# Patient Record
Sex: Female | Born: 1945
Health system: Western US, Academic
[De-identification: ages and names within clinical notes are randomized; demographics above are authoritative.]

---

## 2011-10-20 IMAGING — CR [HOSPITAL] L SPINE
1 series · 2 of 2 positions shown · non-contrast
Comparison: none

HISTORY: Back pain.

[Series 1: view not recorded · 0.17mm/px · 2 of 2 slices shown]
[im 1/2]
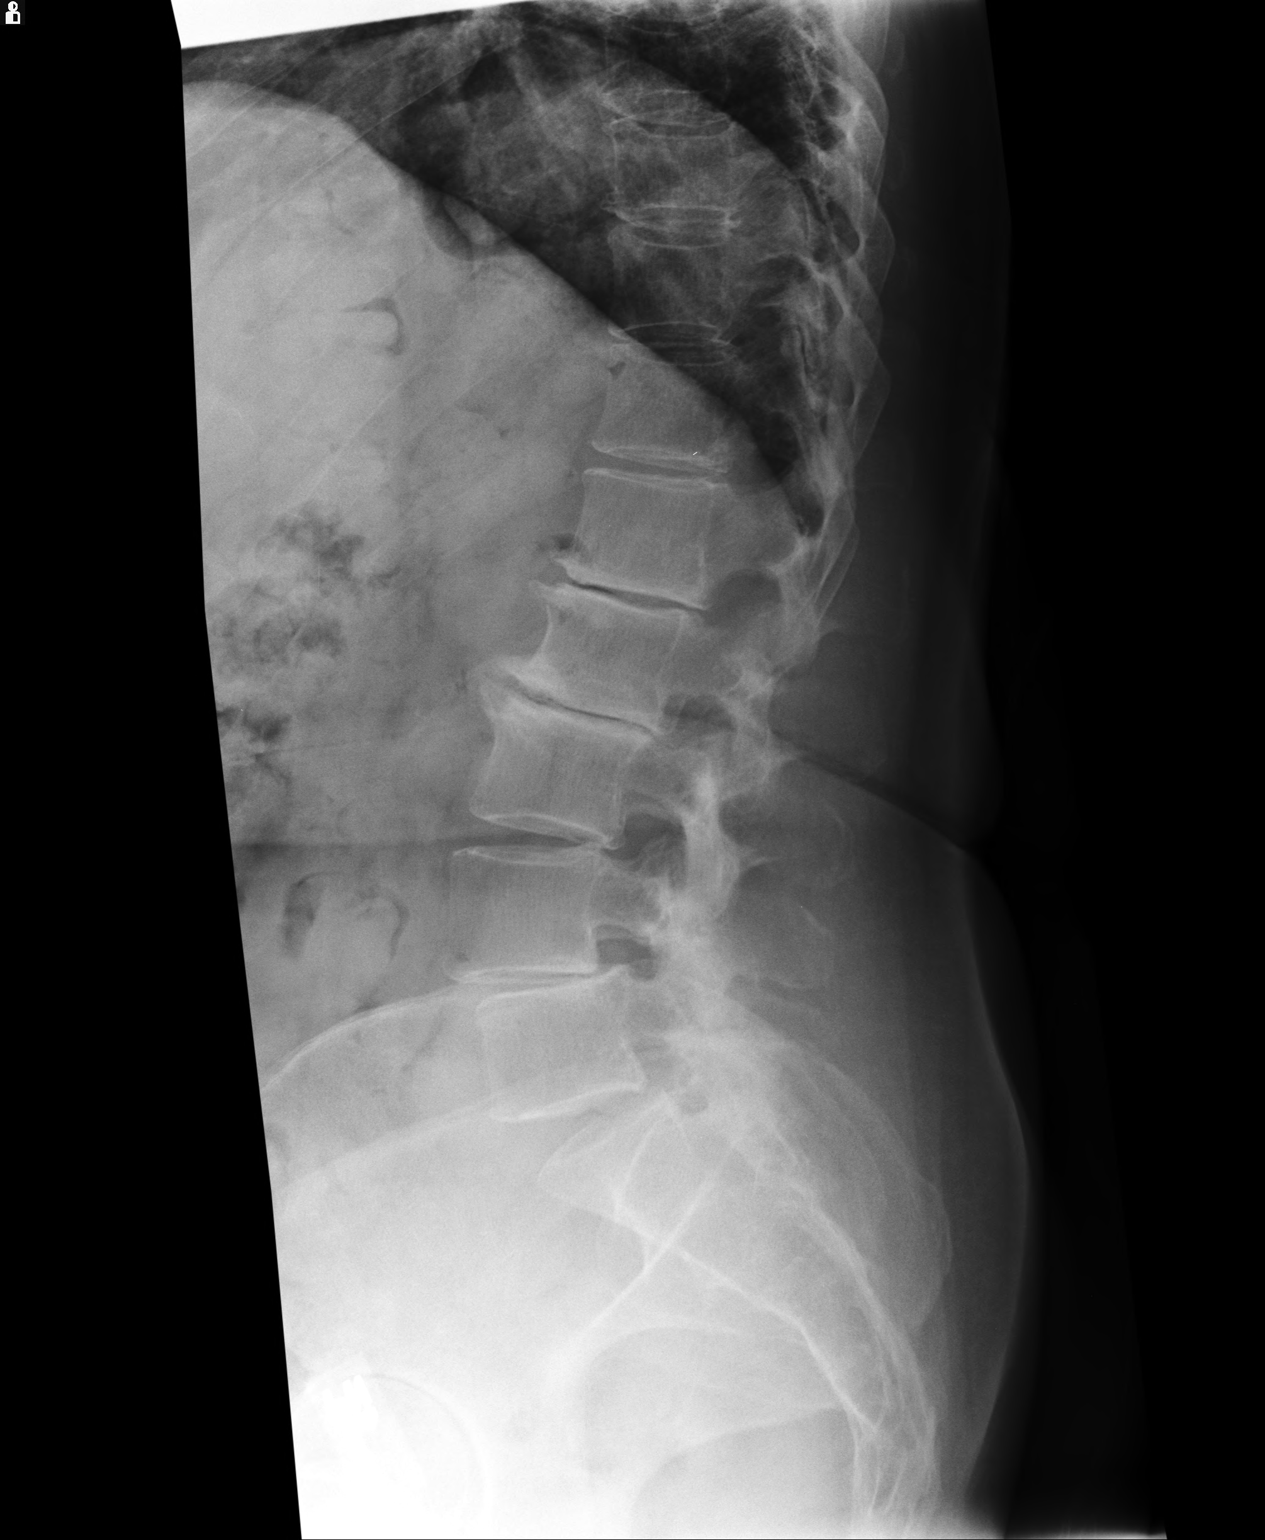
[im 2/2]
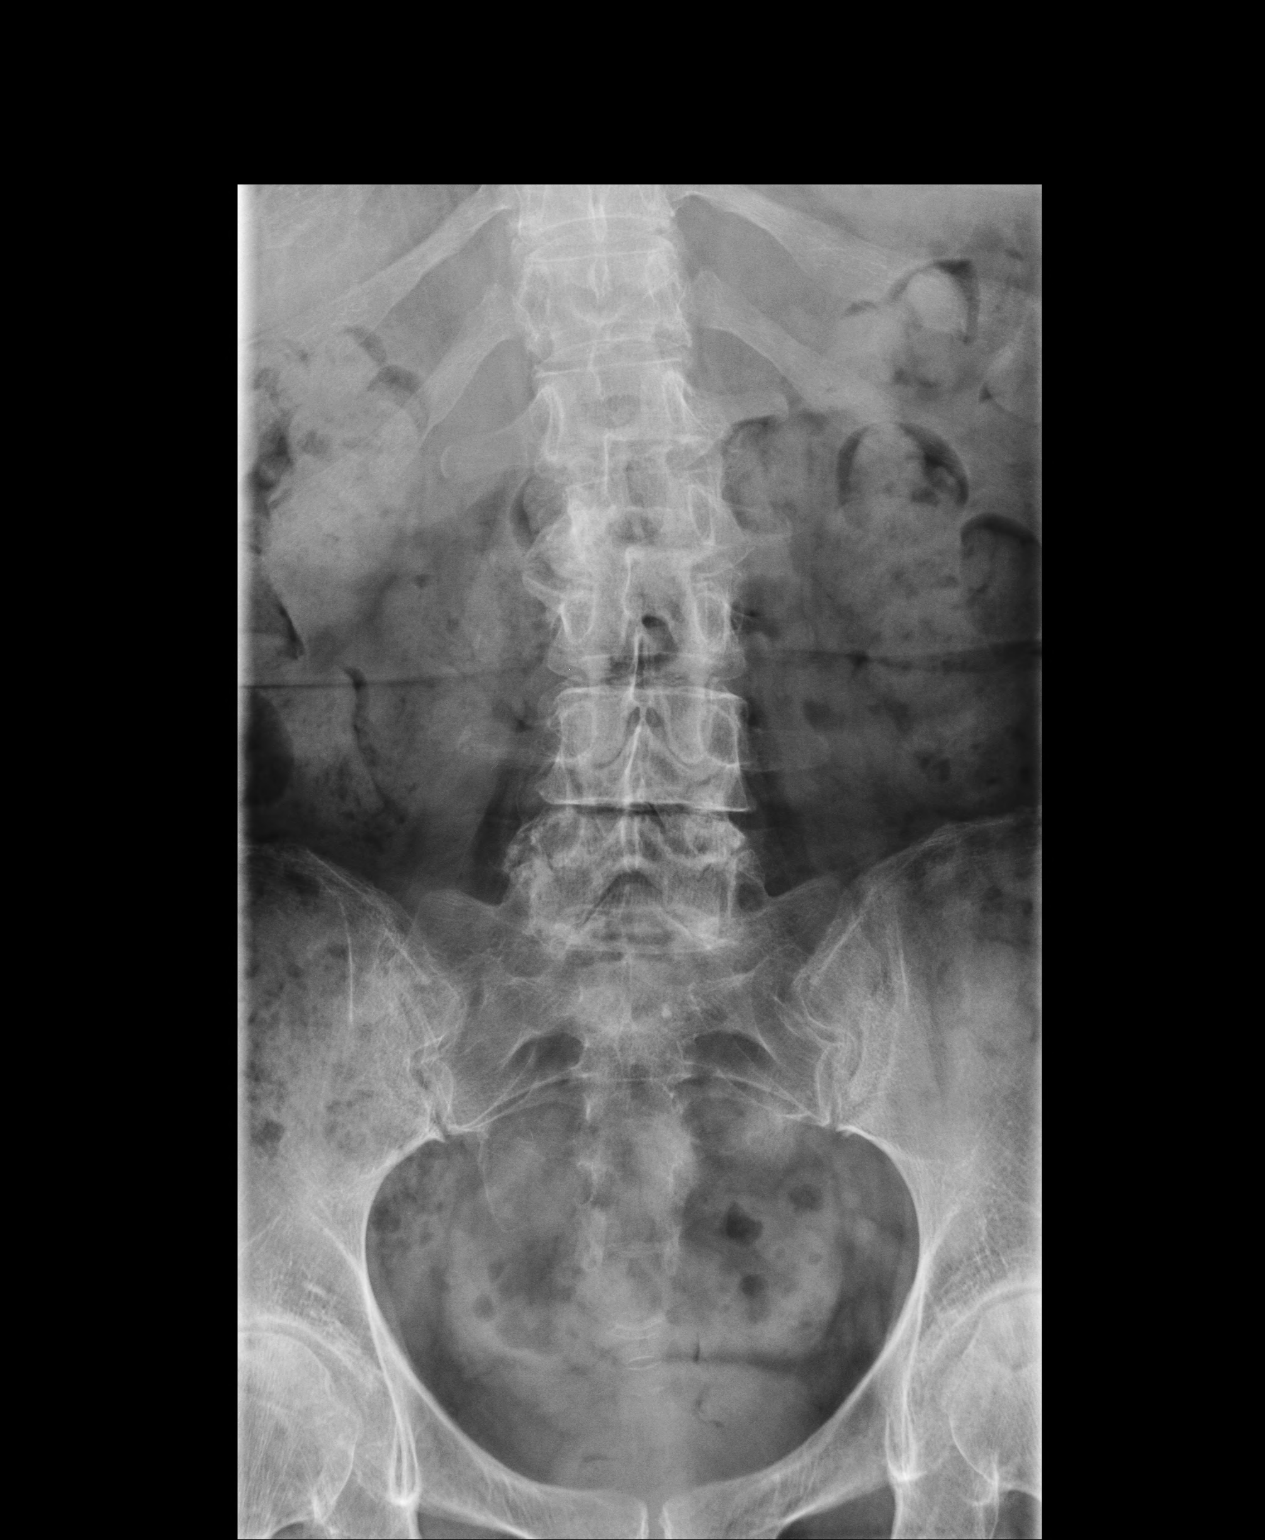

[2 of 2 positions shown; findings below may reference images not displayed]

FINDINGS: Frontal and a lateral view of the lumbar spine, no charge for MR comparison, showed five non-rib-bearing lumbar segments.  There is severe disc space narrowing at L1-2 and L2-3. There is a moderate disc space narrowing at L4-5 with a grade I anterolisthesis for about 7 mm. Vertebral height is maintained.
IMPRESSION: Severe degenerative discogenic disease at L1-2 and L2-3 with a moderate degenerative discogenic disease at L4-5 and grade I anterolisthesis.

## 2012-01-03 IMAGING — OT Imaging study
3 series · 3 of 3 positions shown · non-contrast
Comparison: none

Patient Demographics:  65 year old Caucasian female.
REASON FOR EXAM: Postmenopausal screening.

Risk Factors:   None.
Prior Exams:  Prior DEXA described. Previous DEXA report is not available for review.
Method:  Scans of the spine between L1-L4, forearm and the femoral neck were performed using dual energy X-ray densitometry (DXA) with the Hologic Discovery-SL system.

[Series 1: — · left · 1 of 1 slices shown (1 of 3)]
[im 1/1]
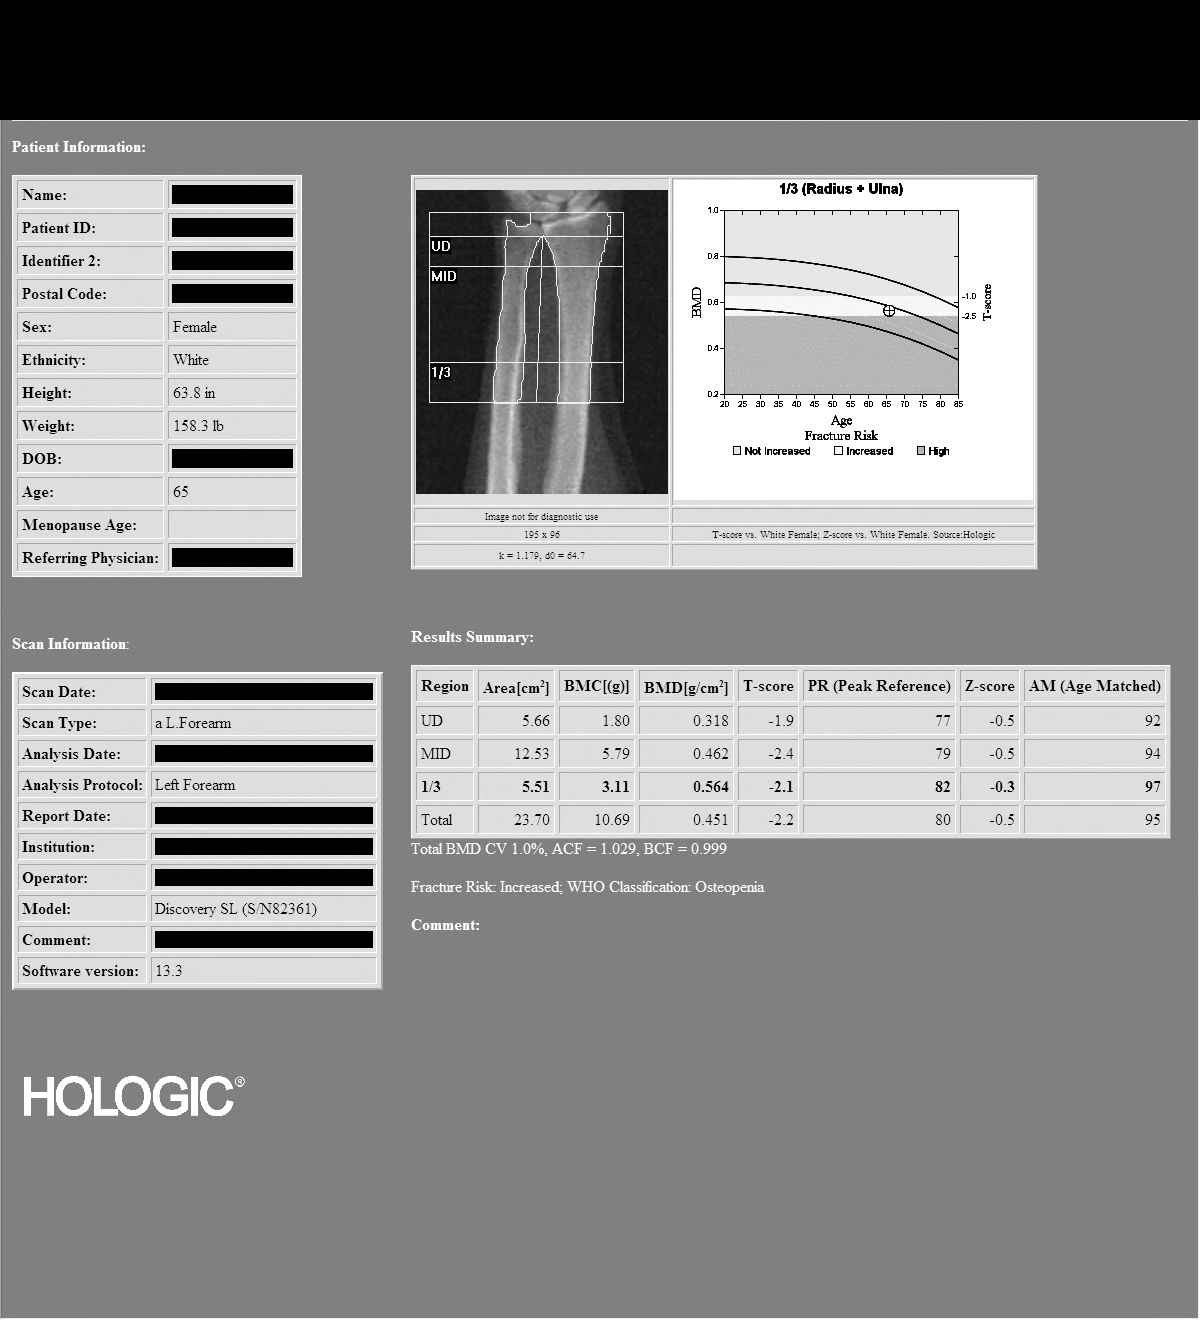

[Series 2: — · 1 of 1 slices shown (2 of 3)]
[im 1/1]
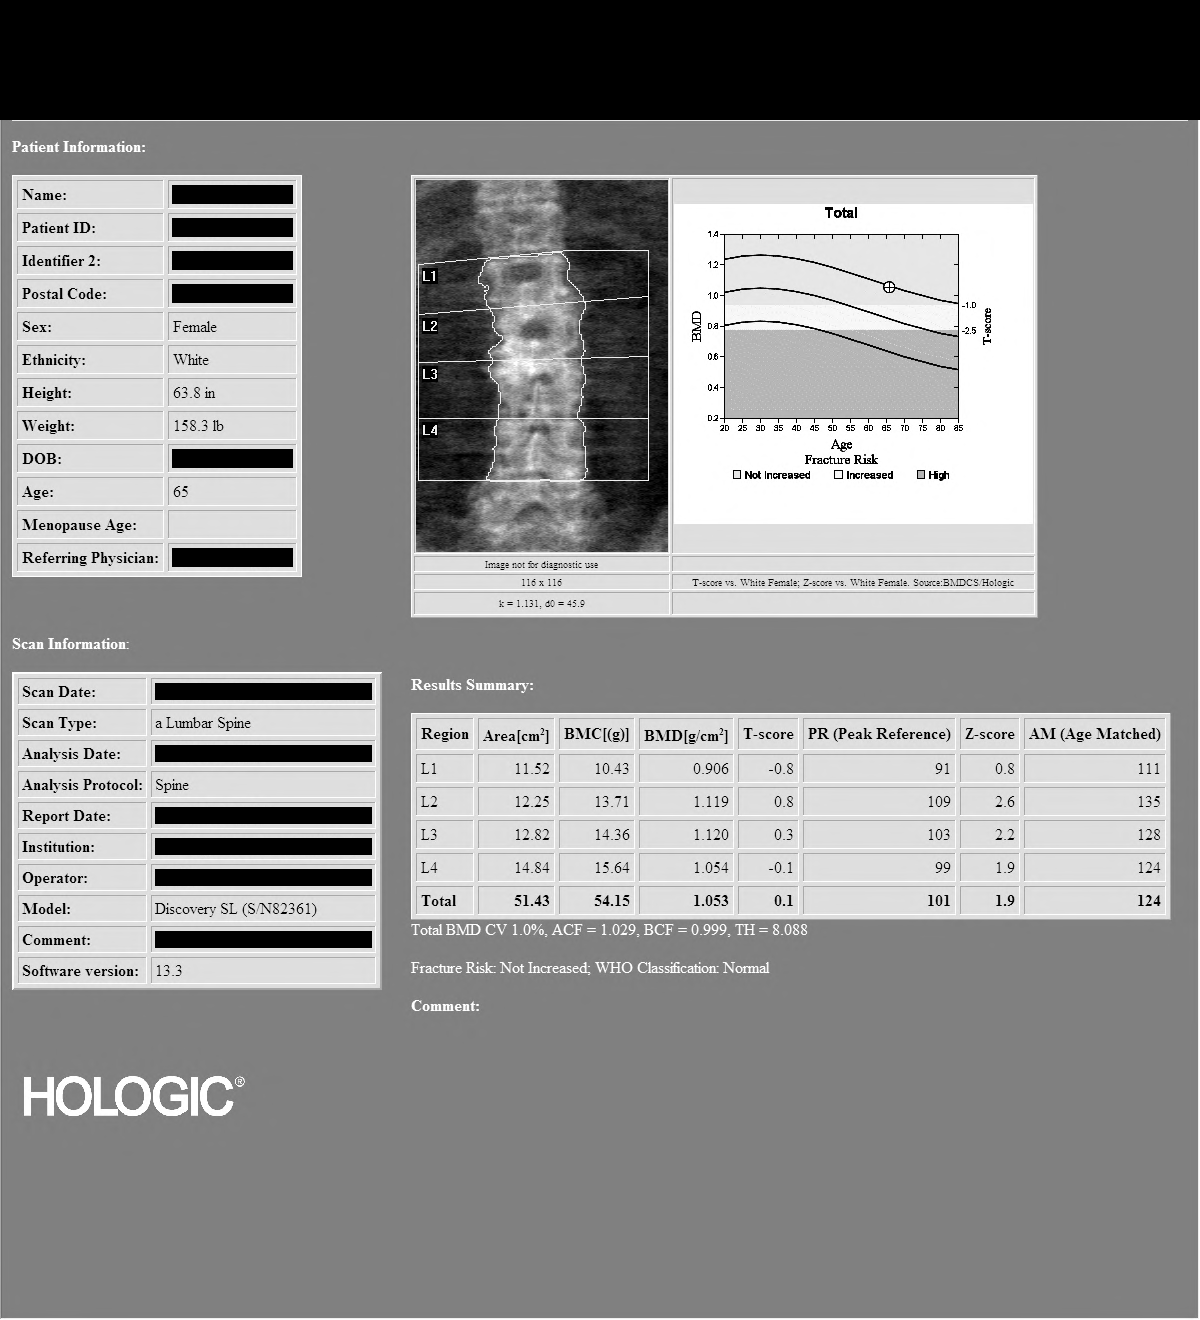

[Series 3: — · left · 1 of 1 slices shown (3 of 3)]
[im 1/1]
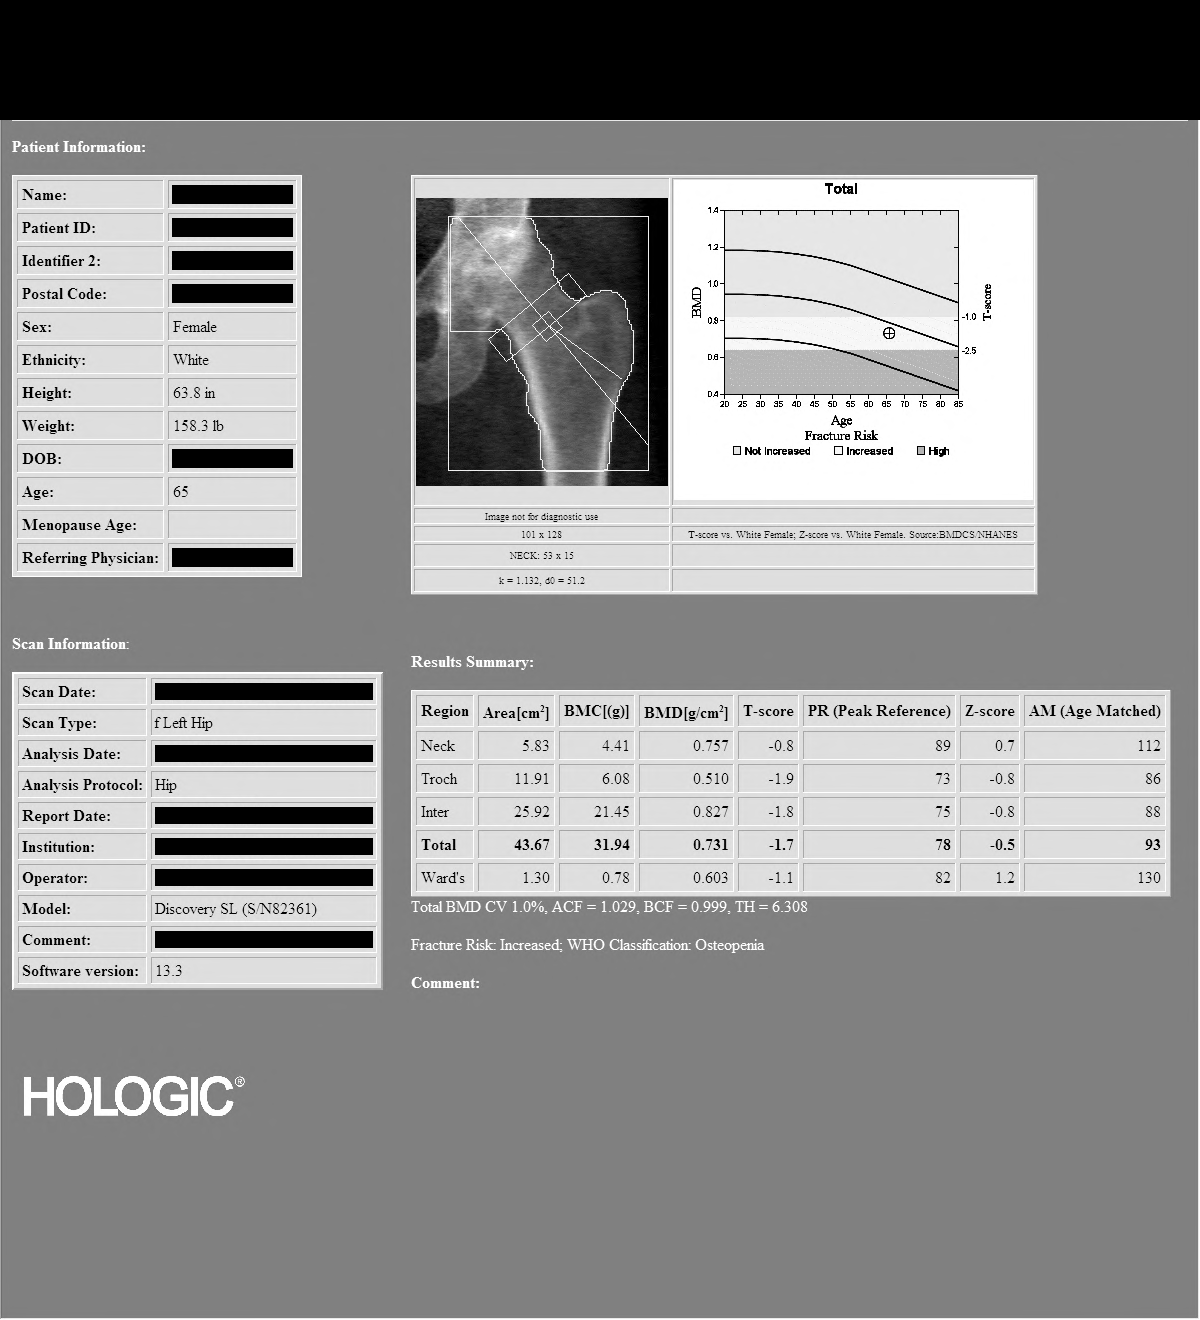

[3 of 3 positions shown; findings below may reference images not displayed]

FINDINGS: 1.    Review of scanogram images shows extensive arthritic change of the lumbar spine which likely falsely elevates results. No lumbar vertebra are excluded from analysis. Therefore, the forearm is added.

2.    Lumbar spine exam shows average Bone Mineral Density is 1.053 gm/cm2 of Hydroxyapatite.  The T-score (comparing patient with a young adult group) is 0.1 standard deviations ABOVE mean.  The Z-score (comparing patient with an age-matched group) is 1.9 standard deviations ABOVE mean.

3.   Left hip exam using total hip region of interest shows Bone Mineral Density is 0.731 gm/cm2 of Hydroxyapatite.  The T-score is 1.7 standard deviations BELOW mean.  The Z-score is 0.5 standard deviations BELOW mean.

4.    Left forearm  exam using [DATE] radius region of interest shows Bone Mineral Density is 0.564 gm/cm2 of Hydroxyapatite.   The T-score is 2.1 standard deviations BELOW mean.  The Z-score is 0.3 standard deviations BELOW mean.

Discussion:  As defined by World Health Organization, the patient meets the criteria for OSTEOPENIA based on both hip and forearm T-scores.  

According to the 4770 World Health Organization risk assessment tool (FRAX), which uses the femoral neck T score and includes other patient risk factors for fracture, the patient has a 10-year absolute risk of hip fracture of 0.5% and 10-year absolute risk fracture for any major fracture of 7.6%.

Recommendations:  In addition to assuring the patient is receiving adequate calcium and vitamin D, the patient states that she is taking supplements on a regular basis, continue being a nonsmoker and regular exercise to patient tolerance would be of benefit.  The patient is currently not taking prescribed medication for prevention of bone loss.  According to new criteria established by The National Osteoporosis Foundation in 4770, THE PATIENT DOES NOT MEET ANY OF THE CURRENT INDICATIONS FOR PRESCRIBED MEDICAL THERAPY.  The National Osteoporosis Foundation now recommends followup DXA scanning every two years in patients at risk regardless of whether the patient is undergoing pharmacological treatment.

World Health Organization criteria for diagnosis.  (For post-menopausal women and men over 50 years old only)

Normal Bone Mass:  T-score -1.0 and above

Low Bone Mass (Osteopenia):  T-score between -1.0 and -2.5

Osteoporosis:  T-score -2.5 and below

Recommendations for Institution of Pharmacologic Therapy National Osteoporosis Foundation 4770

Post-menopausal women and men age 50 and older presenting with the following should be treated:  

Prior hip or vertebral (clinical or morphometric) fracture.

T-score < -2.5 at the femoral neck, total hip or spine after appropriate evaluation to exclude secondary causes for osteoporosis.

Other prior fractures and low bone mass (T-score between -1.0 and -2.5 at the femoral neck, total hip or spine).

Low bone mass (T-score between -1.0 and -2.5 at the femoral neck, total hip or spine) and secondary causes associated with high risk of fracture (such as prolonged glucocorticoid use or total immobilization).

Low bone mass (T-score between -1.0 and -2.5 at the femoral neck, total hip or spine) and 10- yr of hip fracture more or equal to 3% or a 10-yr probability of any major osteoporosis-related fracture  more or equal to 20% based on the [place]adapted WHO algorithm.

## 2013-04-18 IMAGING — MG MAMMO SCRN BIL W/CAD TOMO
8 series · 8 of 8 positions shown · non-contrast
Comparison: none

Images Obtained from [facility]
CLINICAL RA REF: Mammo Scrn bilateral (Digital) W/ Cad Routine with Tomosynthesis.
Digital images were generated from the 3D Tomosynthesis data acquired during the exam.
Comparison is made to exam dated:  05/19/2011 [facility] - [HOSPITAL].
The tissue of both breasts is predominantly fatty.
Current study was also evaluated with a Computer Aided Detection (CAD) system.
No significant masses, calcifications, or other findings are seen in either breast.
There has been no significant interval change.

[R MLO]
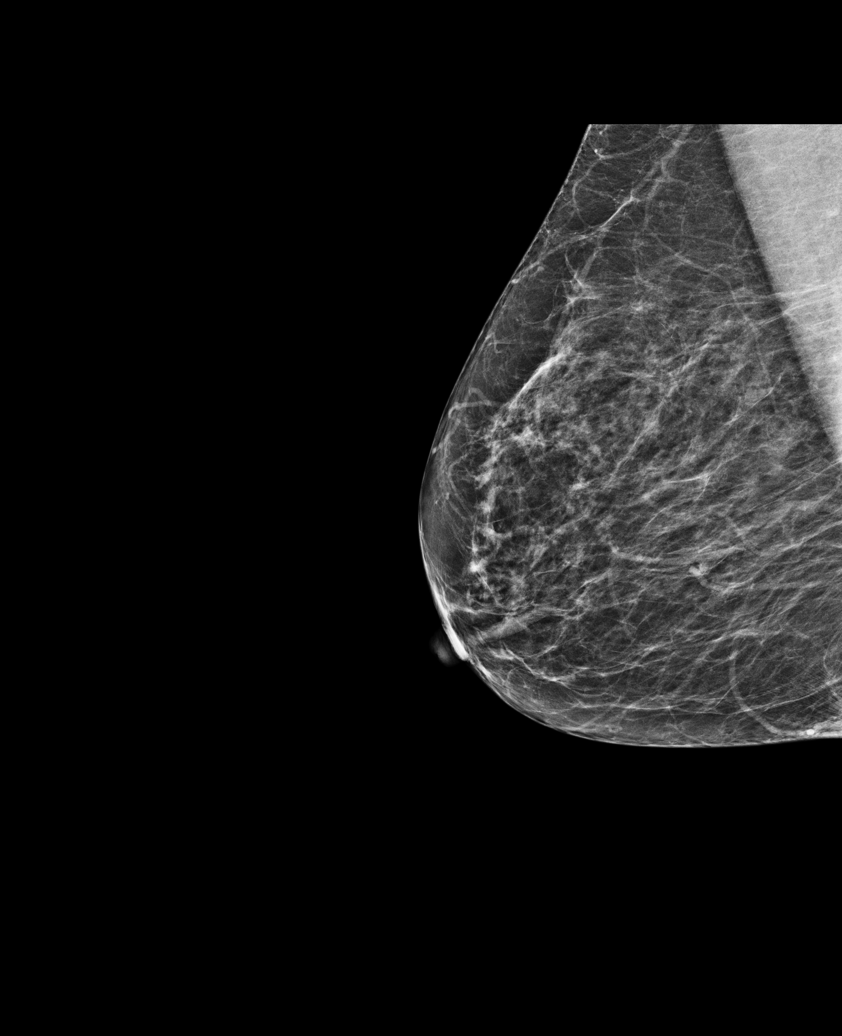

[R CC]
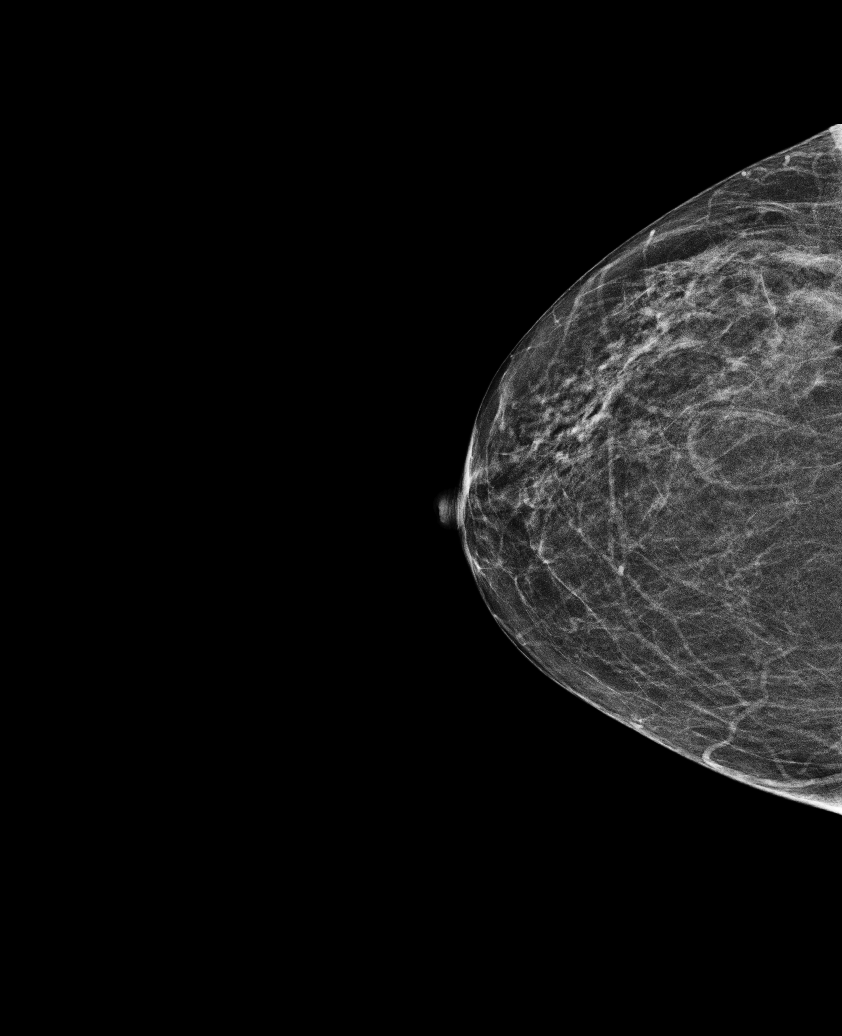

[L CC]
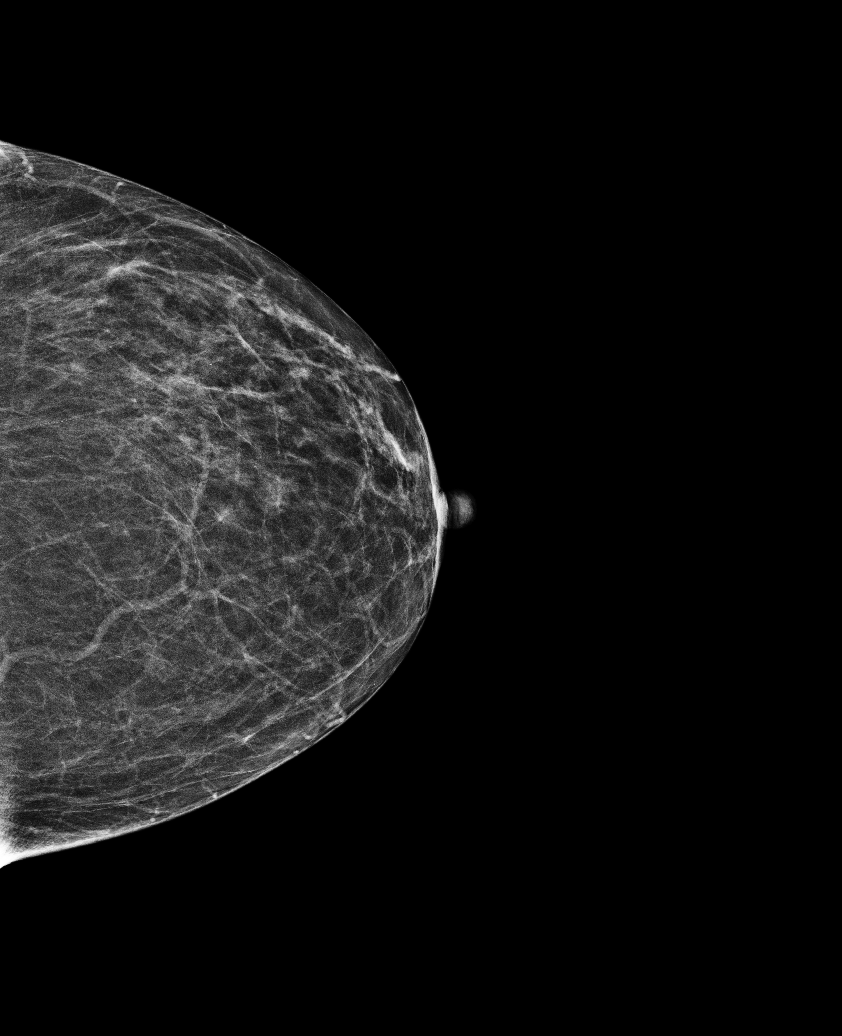

[L MLO]
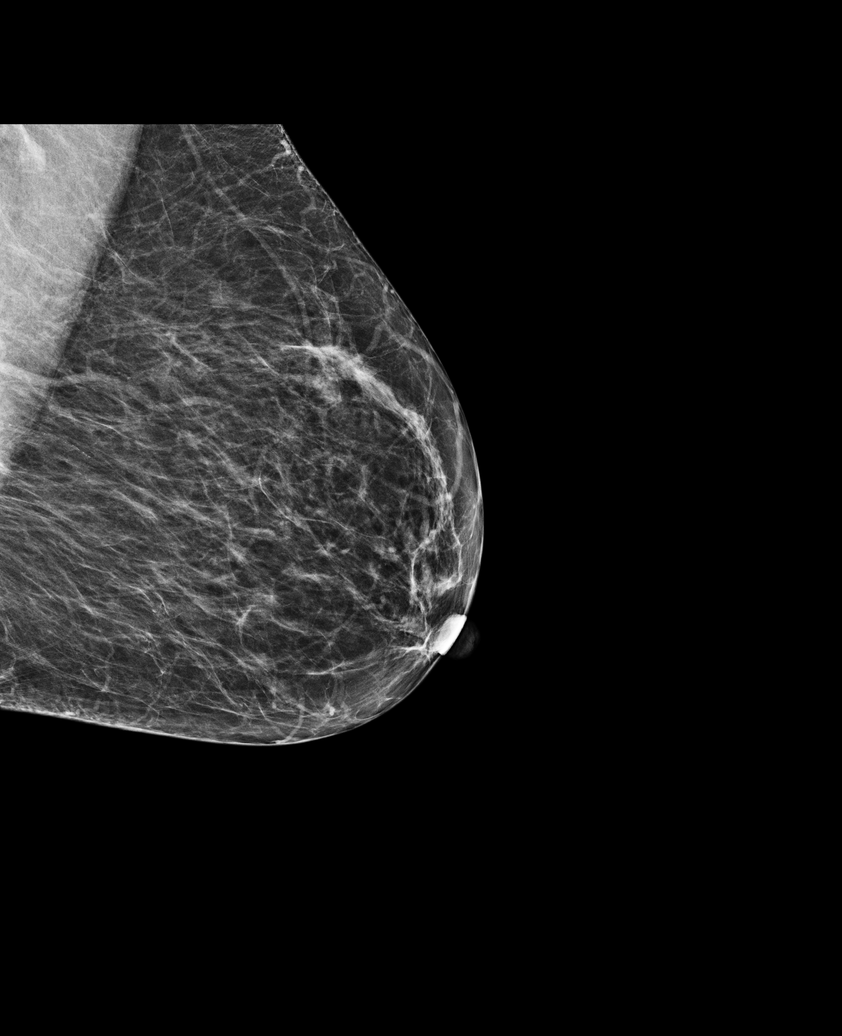

[L CC tomo]
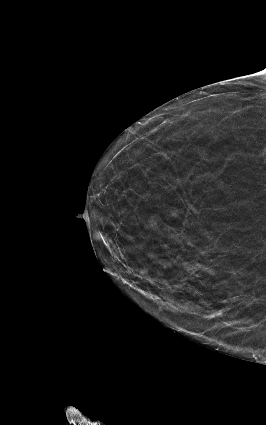

[R MLO tomo]
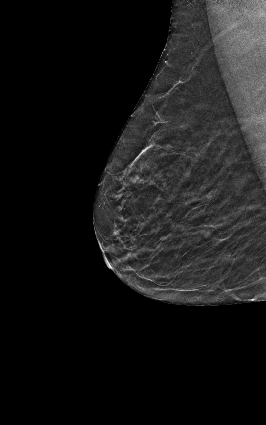

[R CC tomo]
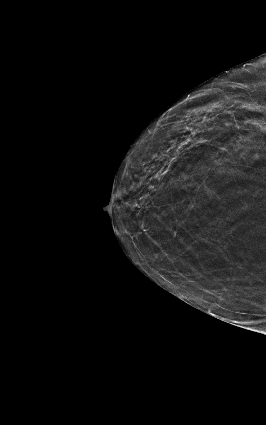

[L MLO tomo]
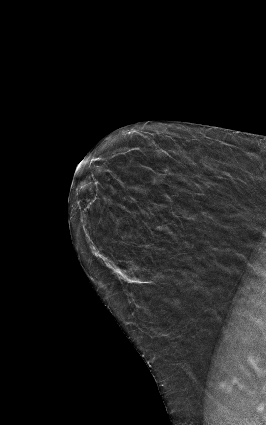

[8 of 8 positions shown; findings below may reference images not displayed]

IMPRESSION: There is no mammographic evidence of malignancy. A 1 year screening mammogram is recommended.
bs/penrad:04/19/2013 [DATE]
letter sent: Normal
Mammogram BI-RADS: 1 Negative   [id number] v20.00 v81.8

## 2014-05-14 IMAGING — US US NON OB TRANSVAGINAL W LTD TA
1 series · 14 of 28 positions shown · non-contrast
Comparison: None.

HISTORY: Postmenopausal bleeding. Patient uses vaginal cream hormones.
TECHNIQUE: Transvaginal and transabdominal pelvic ultrasound.

[Series 1: us non ob transvaginal w ltd ta · 14 of 37 slices shown]
[im 2/37]
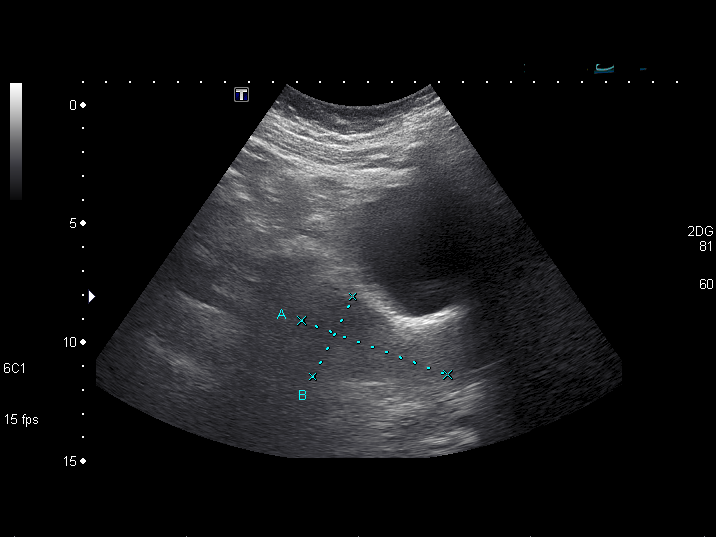
[im 5/37]
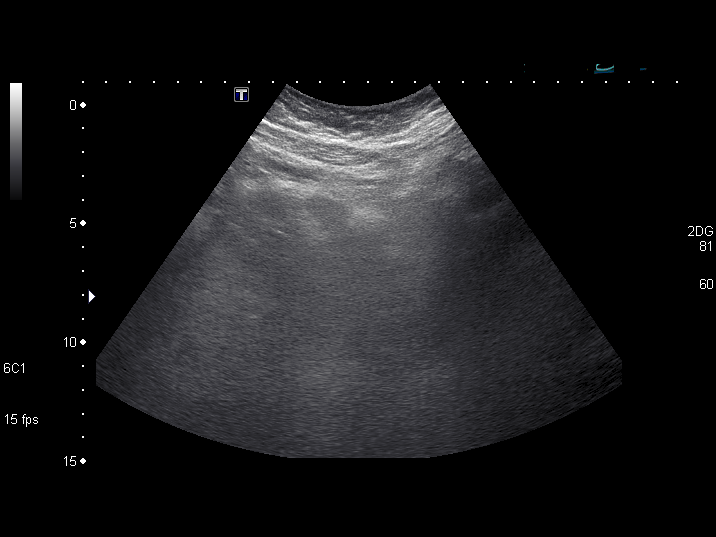
[im 7/37]
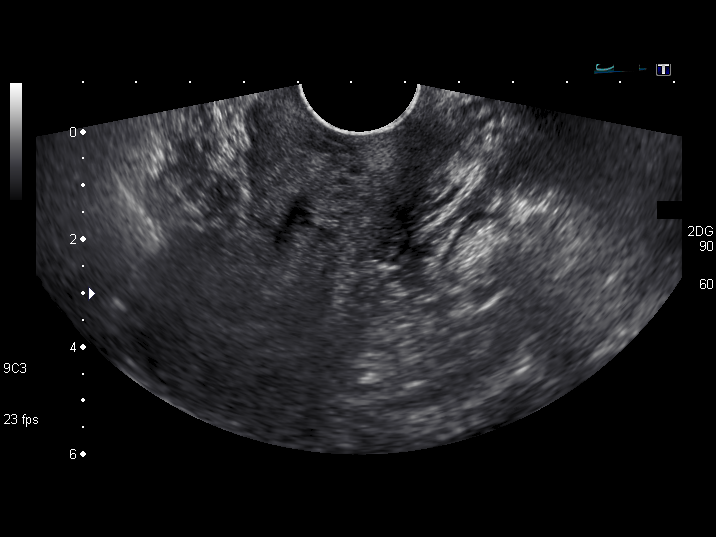
[im 10/37]
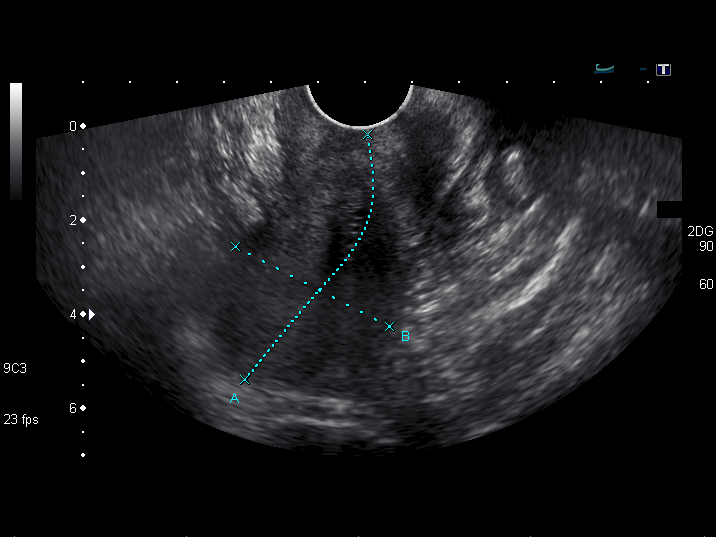
[im 13/37]
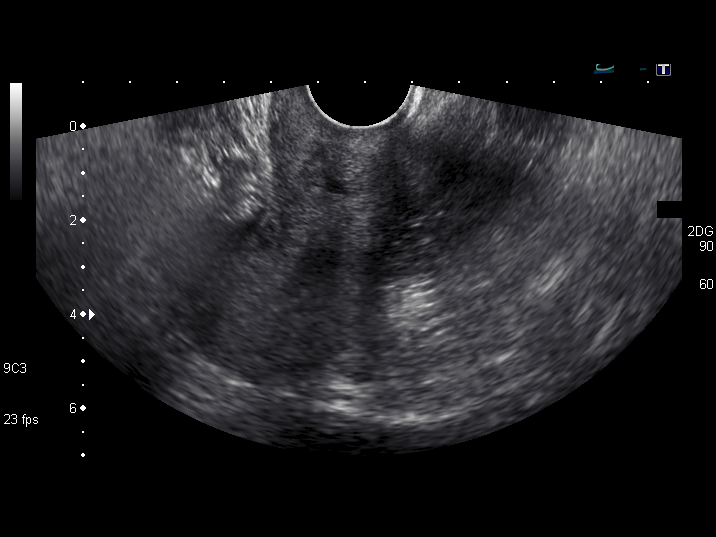
[im 15/37]
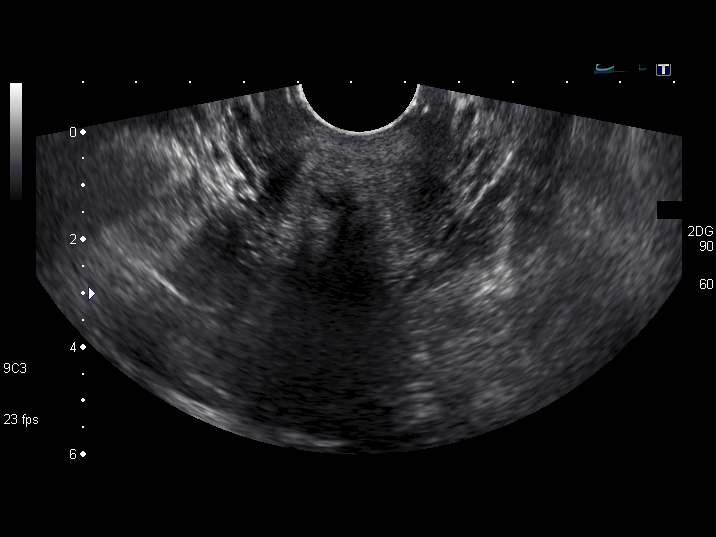
[im 18/37]
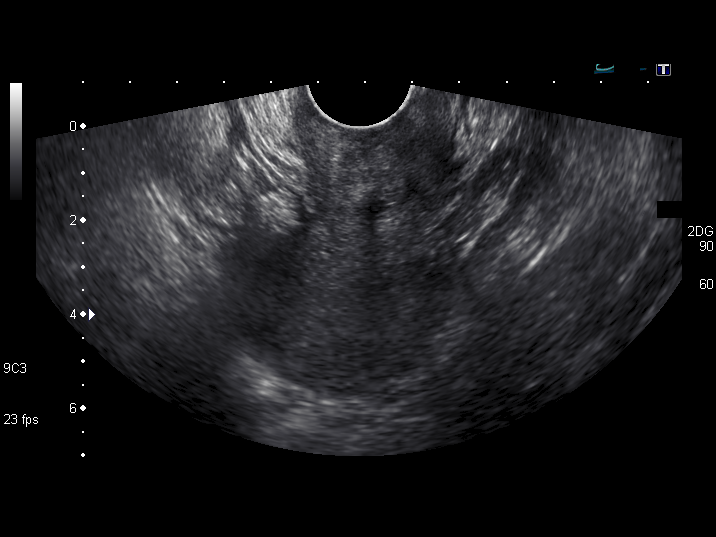
[im 21/37]
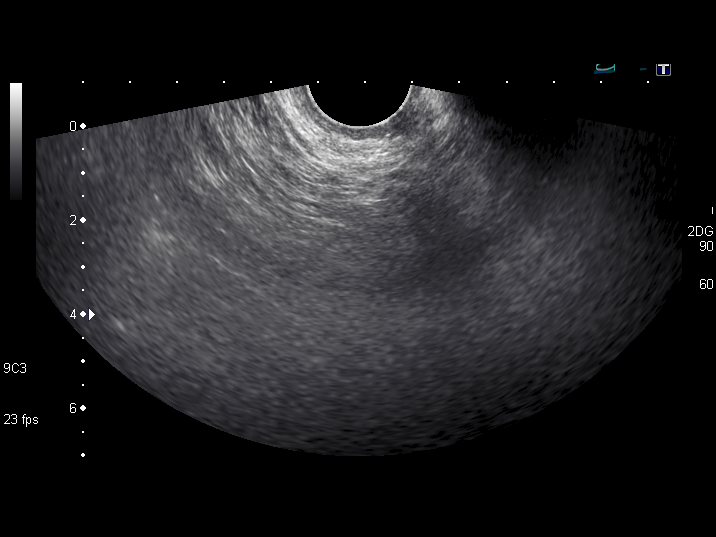
[im 23/37]
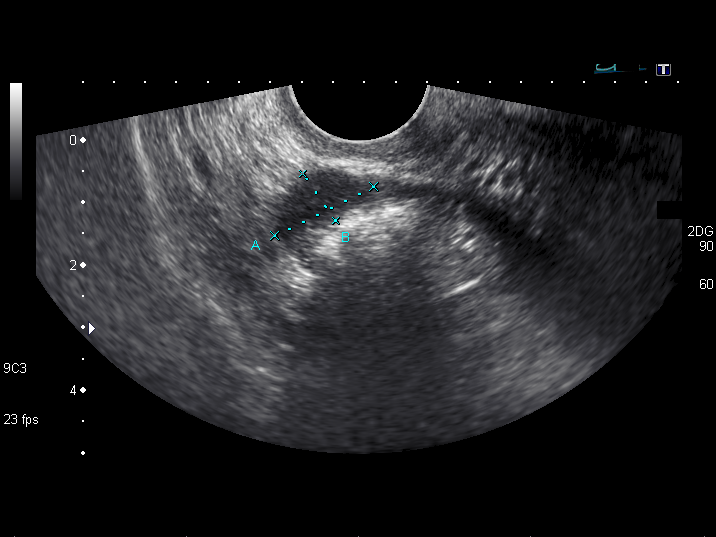
[im 26/37]
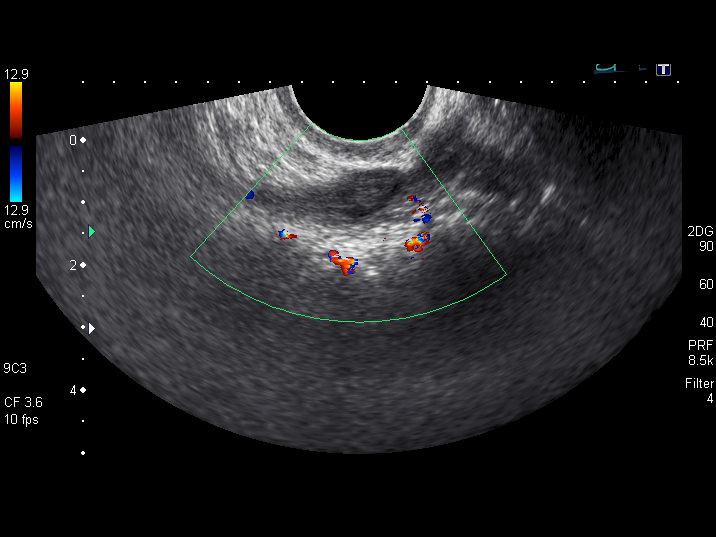
[im 29/37]
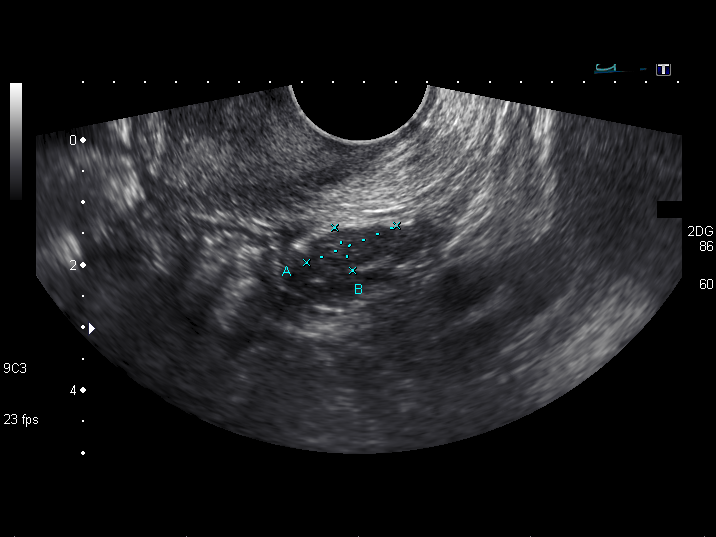
[im 31/37]
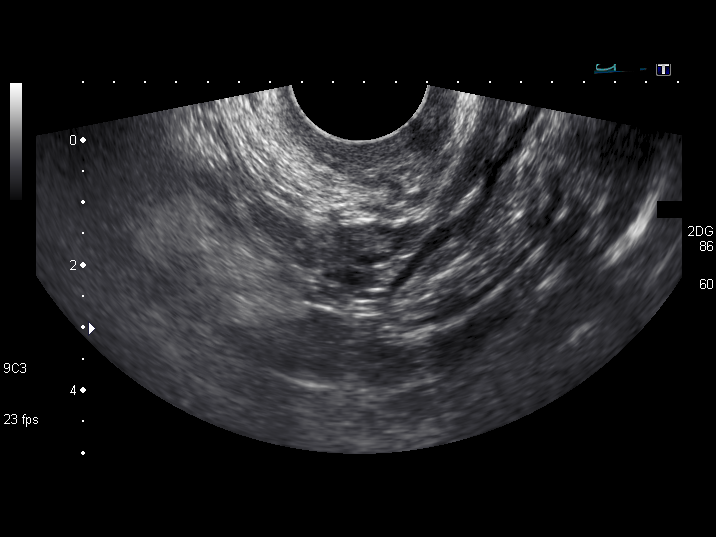
[im 34/37]
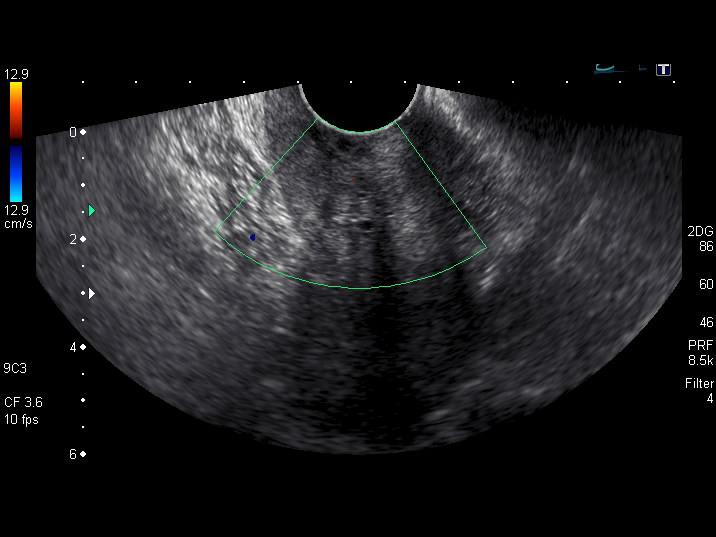
[im 37/37]
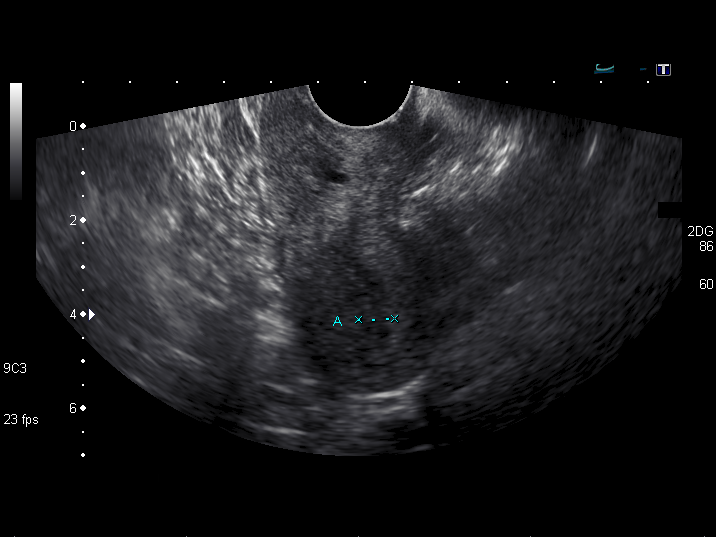

[14 of 28 positions shown; findings below may reference images not displayed]

FINDINGS: The uterus is retroflexed and measures 61 x 37 x 50 mm. Endometrium is 7.6 mm which likely reflects hormone vaginal cream use. Heterogeneous appearance is demonstrated at the cervical junction with the lower uterine segment. Small nabothian cyst is noted at the cervix.

Right ovary measures 18 x 9 x 14 mm.

Left ovary measures 16 x 7 x 11 mm. 

No adnexal masses. There is no abnormal fluid in the cul-de-sac.
IMPRESSION: 1. Retroflexed uterus with 7.6 mm thick endometrial stripe. Patient describes vaginal cream hormone use and this may be within normal limits.

2. Heterogeneous appearance of the lower uterine segment with upper cervix and small cervical nabothian cysts.

3. Normal appearance of bilateral ovaries for a postmenopausal patient. No additional pelvic abnormalities are seen.

## 2014-05-14 IMAGING — MG MAMMO SCRN BIL W/CAD TOMO
8 series · 8 of 24 positions shown · non-contrast
Comparison: none

Images Obtained from [facility]
CLINICAL RA REF: Mammo Scrn bilateral (Digital) W/Cad Routine with Tomosynthesis. Mother with postmenopausal breast cancer.
Digital images were generated from the 3D Tomosynthesis data acquired during the exam.
Comparison is made to exams dated:  04/18/2013 [HOSPITAL] - [HOSPITAL] and 05/19/2011 [facility] - [HOSPITAL].
There are scattered fibroglandular elements in both breasts.
Current study was also evaluated with a Computer Aided Detection (CAD) system.
No significant masses, calcifications, or other findings are seen in either breast.
There has been no significant interval change.

[L MLO]
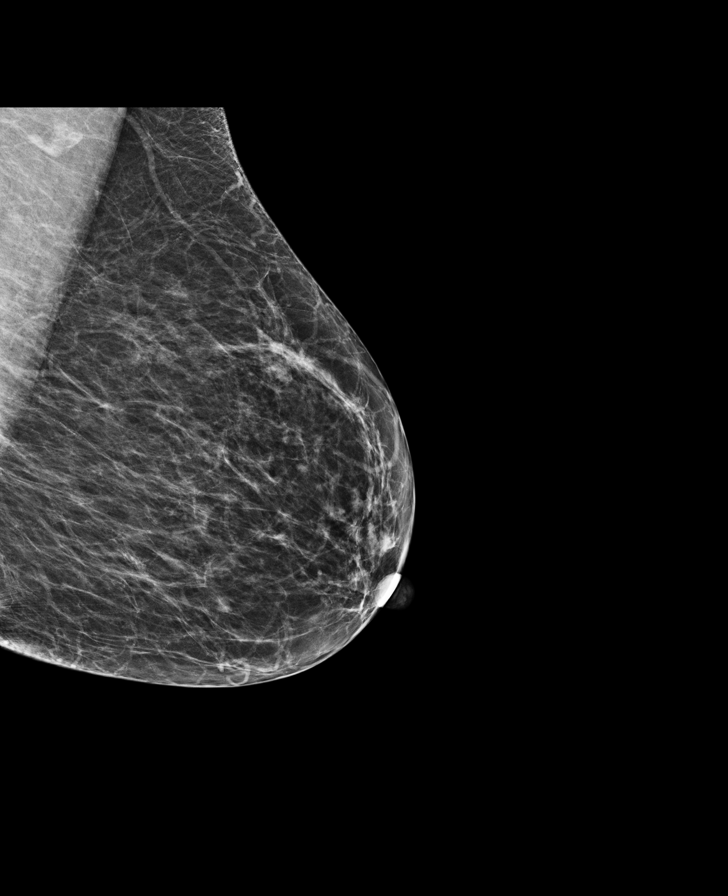

[L CC]
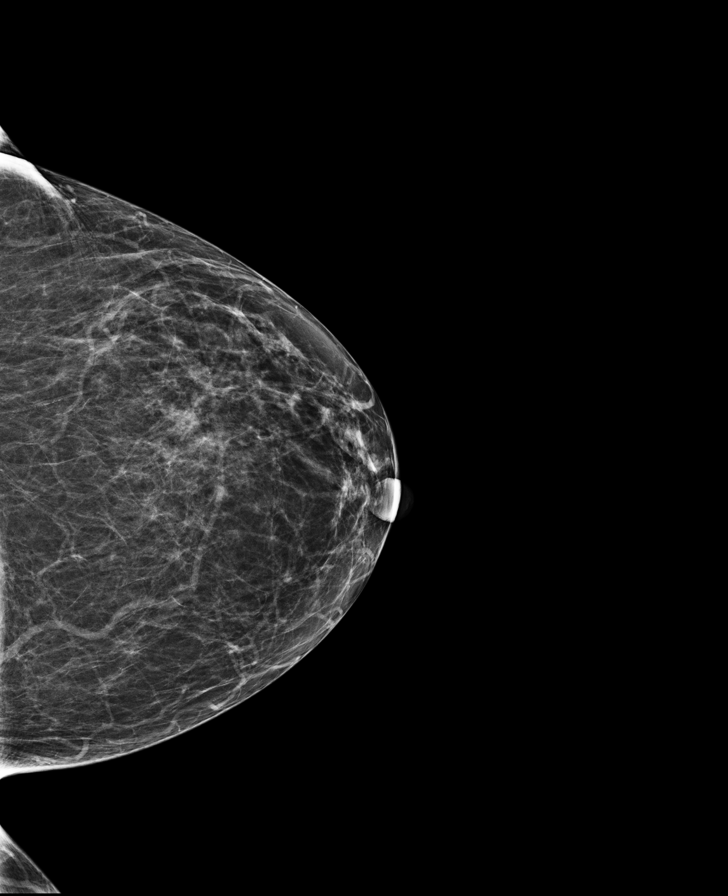

[R MLO]
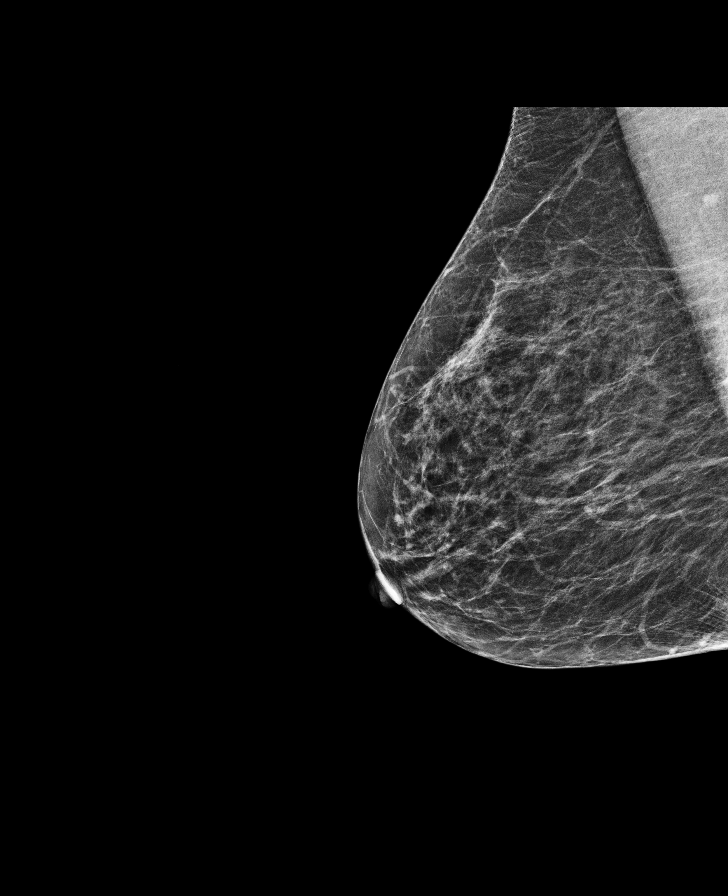

[R CC]
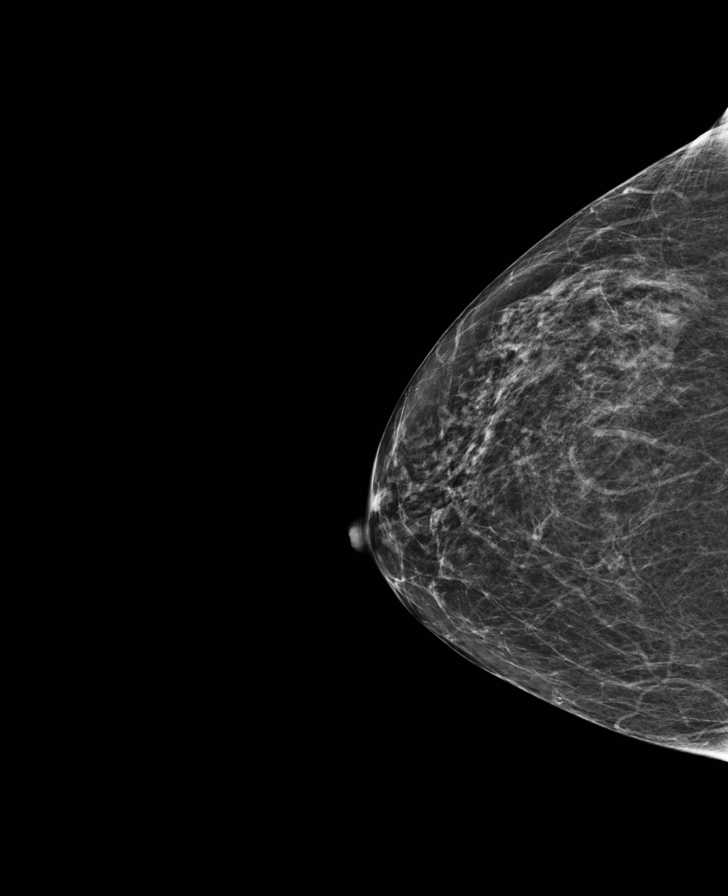

[R CC tomo · tomo slice 25/50.0]
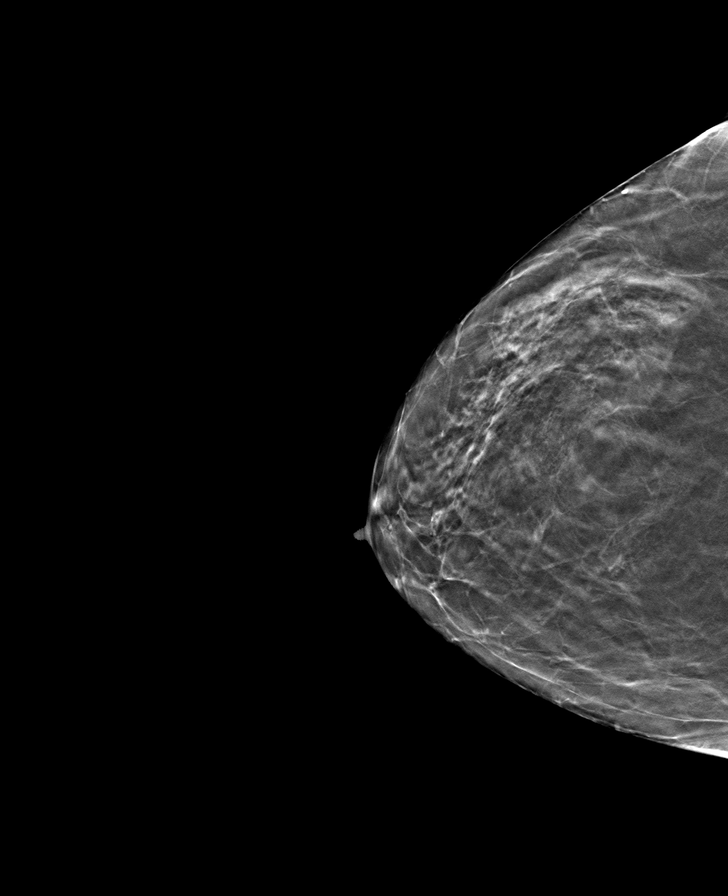

[L MLO tomo · tomo slice 29/56.0]
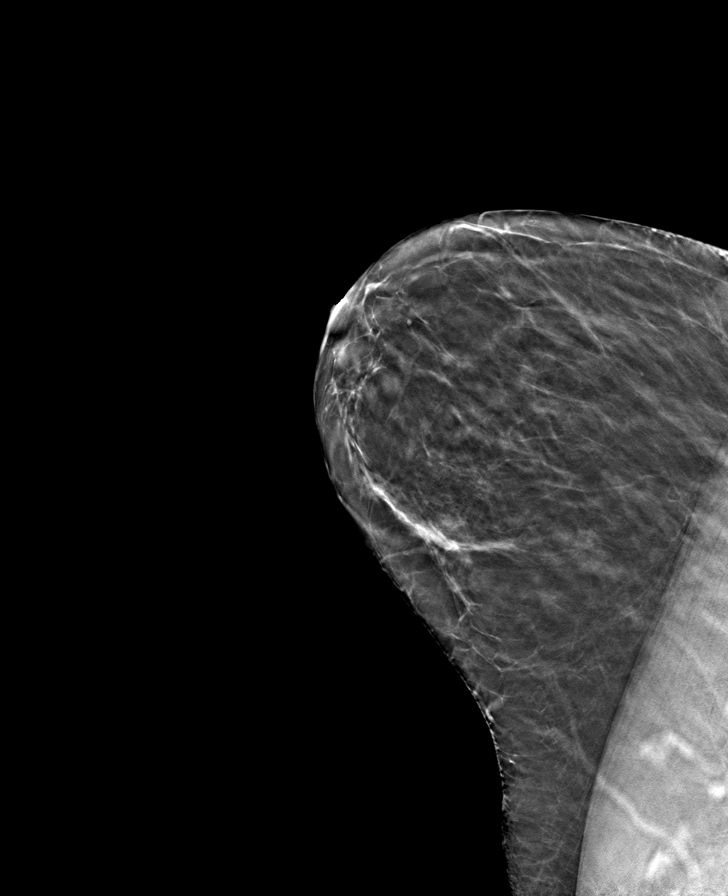

[R MLO tomo · tomo slice 27/53.0]
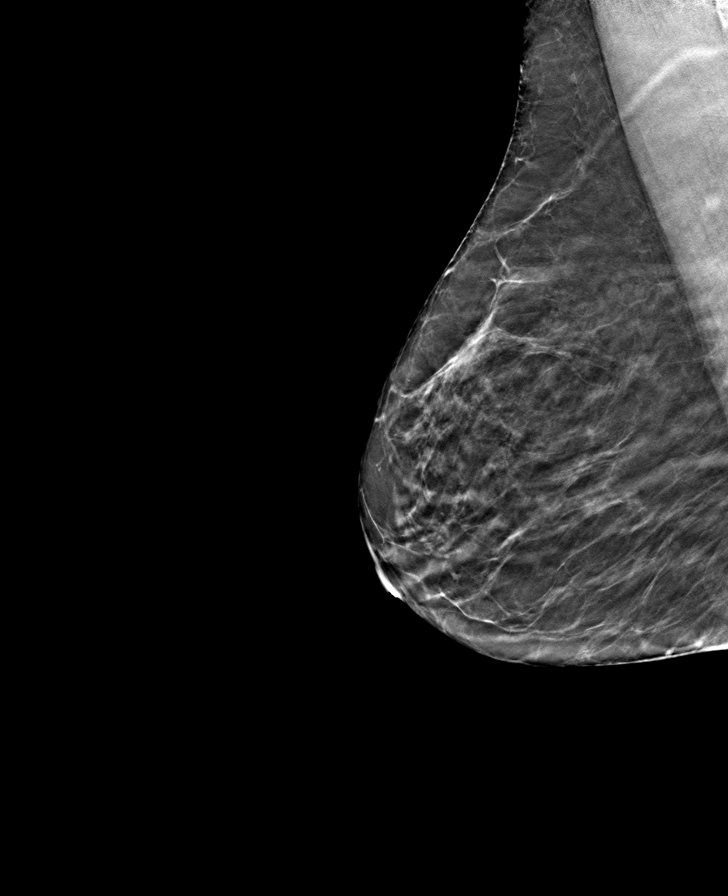

[L CC tomo · tomo slice 27/54.0]
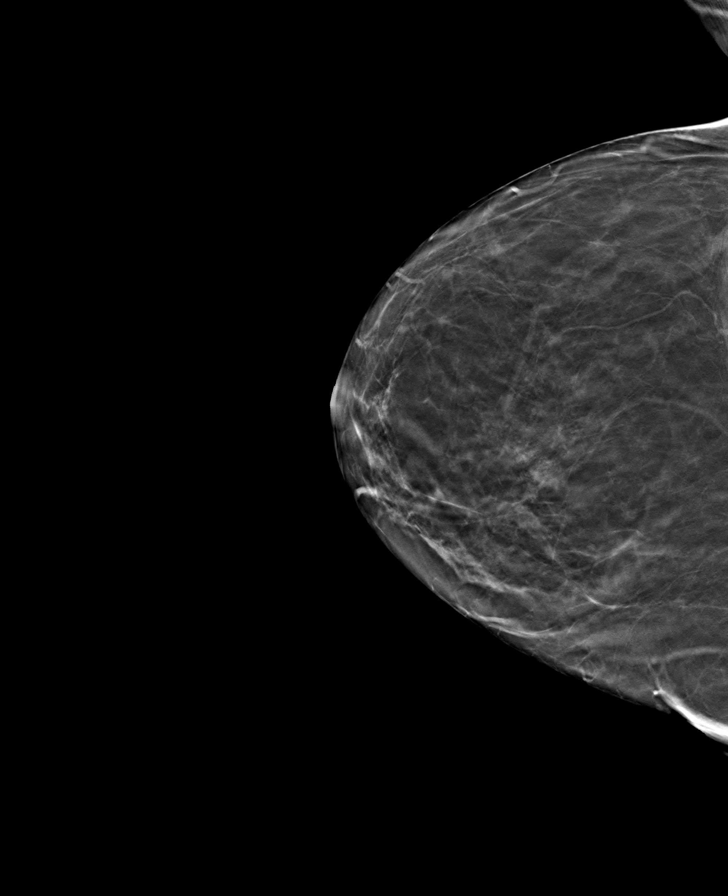

[8 of 24 positions shown; findings below may reference images not displayed]

IMPRESSION: There is no mammographic evidence of malignancy. A 1 year screening mammogram is recommended.
mc/penrad:05/15/2014 [DATE]
letter sent: Normal
Mammogram BI-RADS: 1 Negative   [id number]

## 2014-07-14 IMAGING — CR L-SPINE 4 VWS MIN
1 series · 4 of 4 positions shown · non-contrast
Comparison: October 20, 2011.

HISTORY: Back pain. Spinal stenosis.
TECHNIQUE: Lumbar spine x-ray series 4 views including standing AP, lateral and lateral flexion-extension.

[Series 1: AP · 0.17mm/px · 4 of 4 slices shown]
[im 1/4]
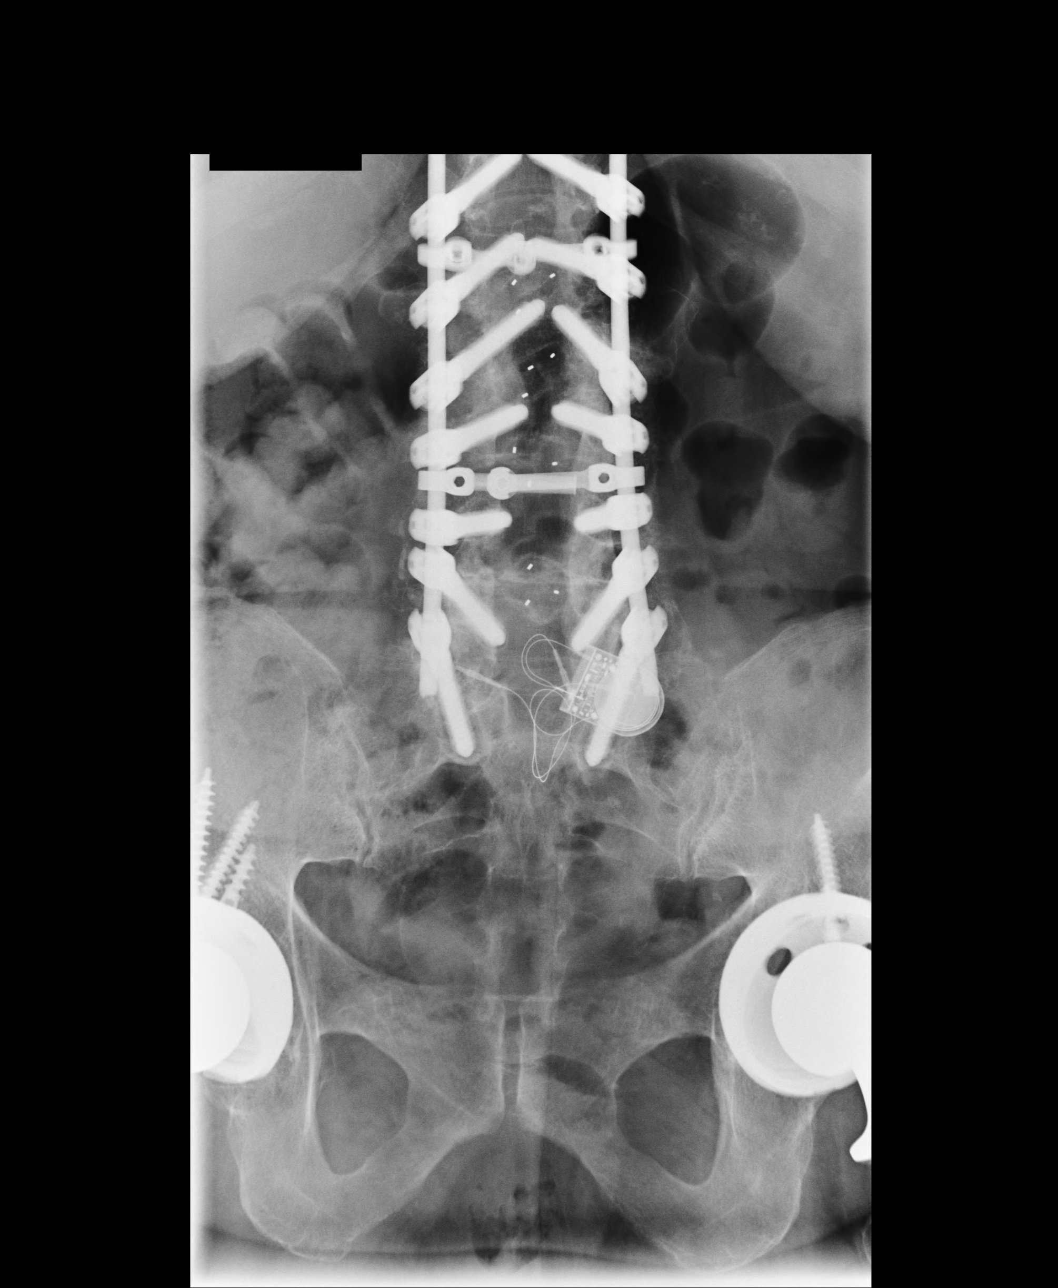
[im 2/4]
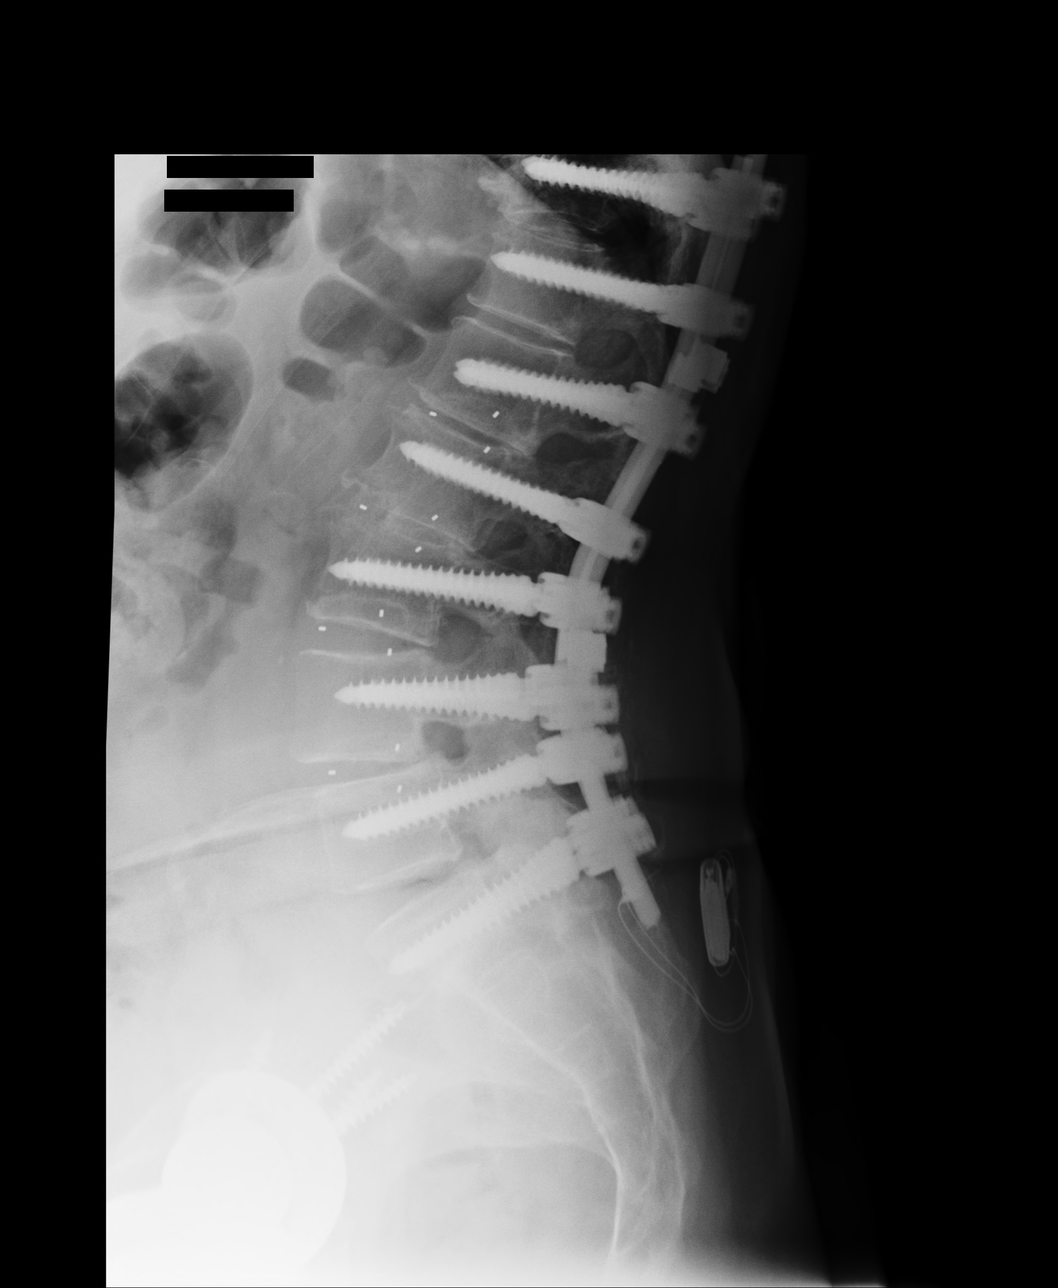
[im 3/4]
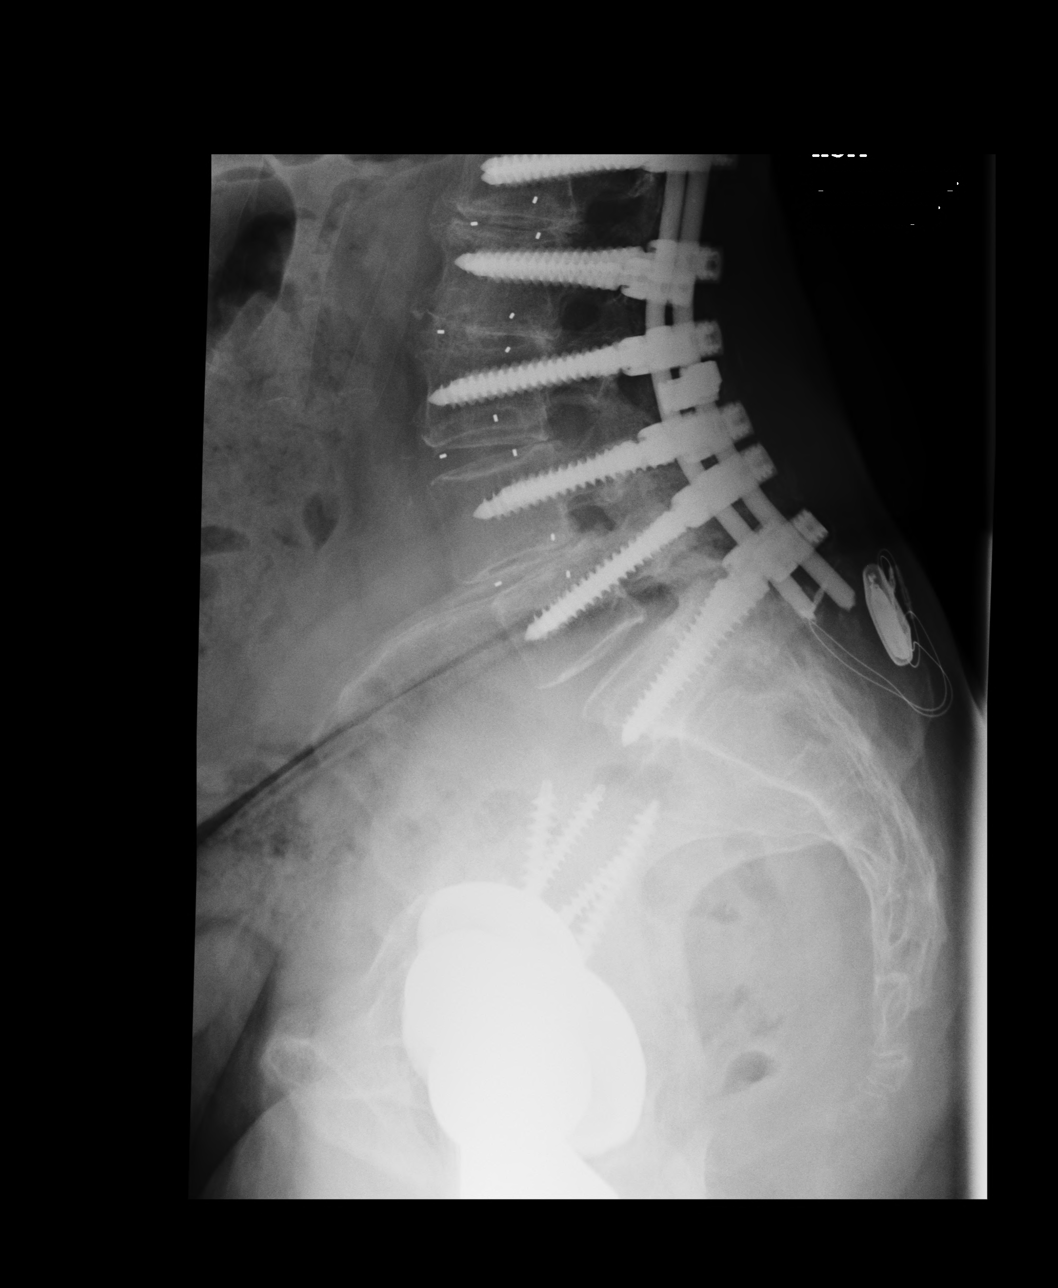
[im 4/4]
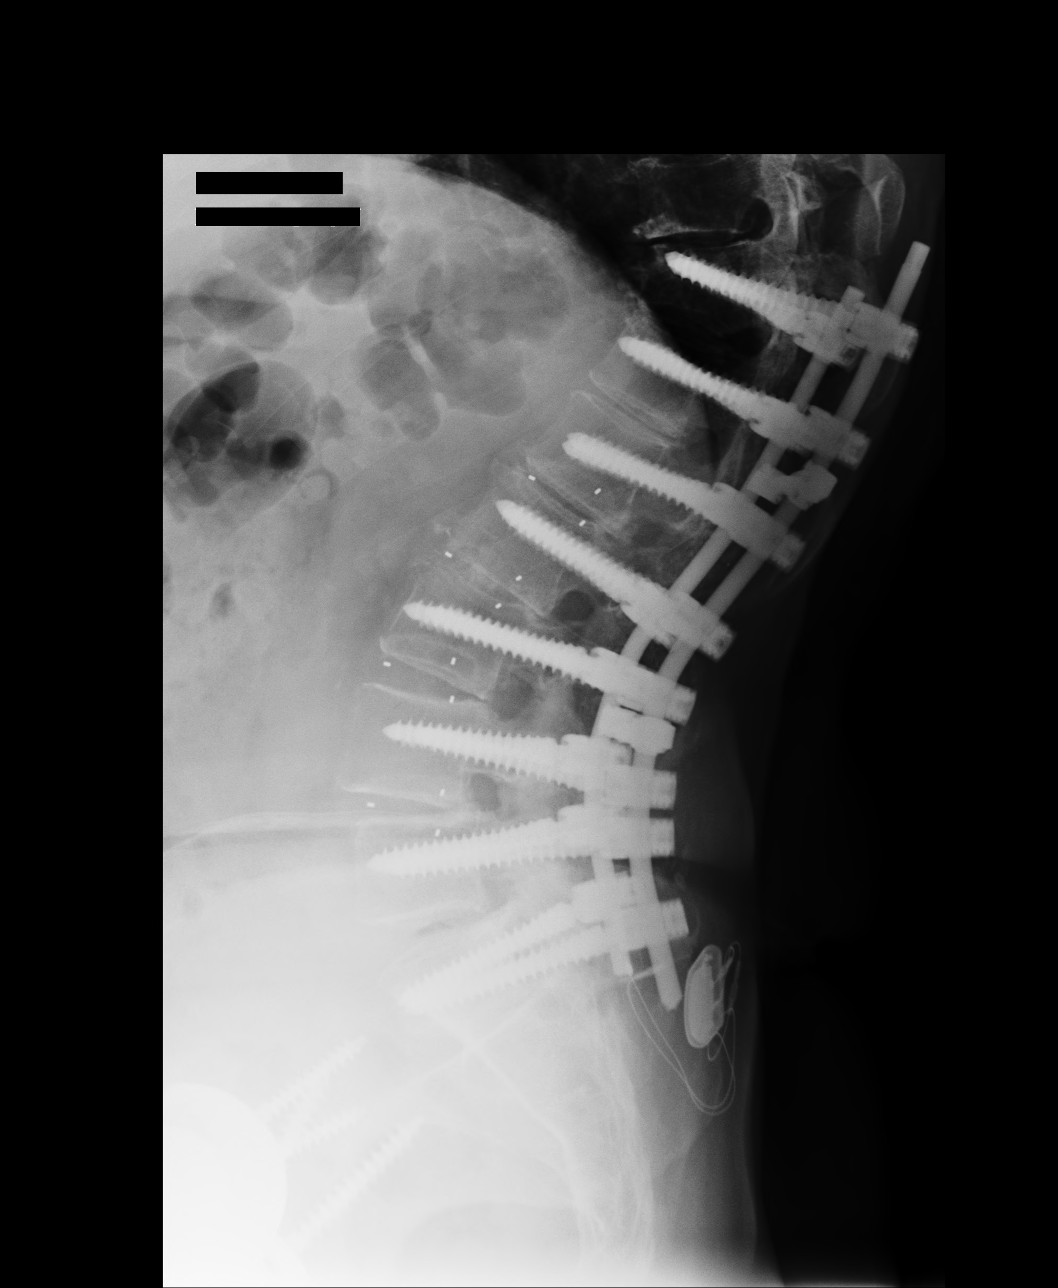

[4 of 4 positions shown; findings below may reference images not displayed]

FINDINGS: Patient has had extensive posterior decompression and posterior fixation with pedicle screws that extend from T11 to S1 with bilateral screws and 2 transverse bars and vertical connecting bars. In addition there are disc space markers probably grafts at L1 through L4-5. There is disc space narrowing L4-5 as well. Curvature and alignment appear normal. Since the last study however the T11 vertebral body shows a mild wedge deformity of about 10-20% that was not seen previously. In addition the anterolisthesis of L4 on L5 by about 6 to 7 mm is unchanged and stable. Bone stimulator device is present with battery pack. SI joints not fused. Bilateral hip arthroplasties.

The S1 screws extend just slightly beyond the anterior cortex of the sacrum and there is mild lucency around the screws in S1.

Flexion-extension views show no visualized motion during flexion-extension of the operative site and there is no hardware failure seen.
IMPRESSION: 1. T11 through S1 pedicle screw fusion.

2. No evidence of motion seen during flexion-extension.

3. Mild wedge deformity of T11, see above.

## 2015-07-09 IMAGING — MG MAMMO SCRN BIL W/CAD TOMO
8 series · 8 of 24 positions shown · non-contrast
Comparison: none

Images Obtained from [facility]
CLINICAL RA REF: Mammo Scrn bilateral (Digital) W/Cad Routine with Tomosynthesis. Mammo Scrn bilateral (Digital) W/Cad Routine with Tomosynthesis.
Digital images were generated from the 3D Tomosynthesis data acquired during the exam.
Comparison is made to exams dated:  05/14/2014, 04/18/2013 [HOSPITAL] - [HOSPITAL], and 05/19/2011 [facility] - [HOSPITAL].
There are scattered fibroglandular elements in both breasts.
Current study was also evaluated with a Computer Aided Detection (CAD) system.
No significant abnormalities are seen in either breast.
There has been no significant interval change.

[L MLO]
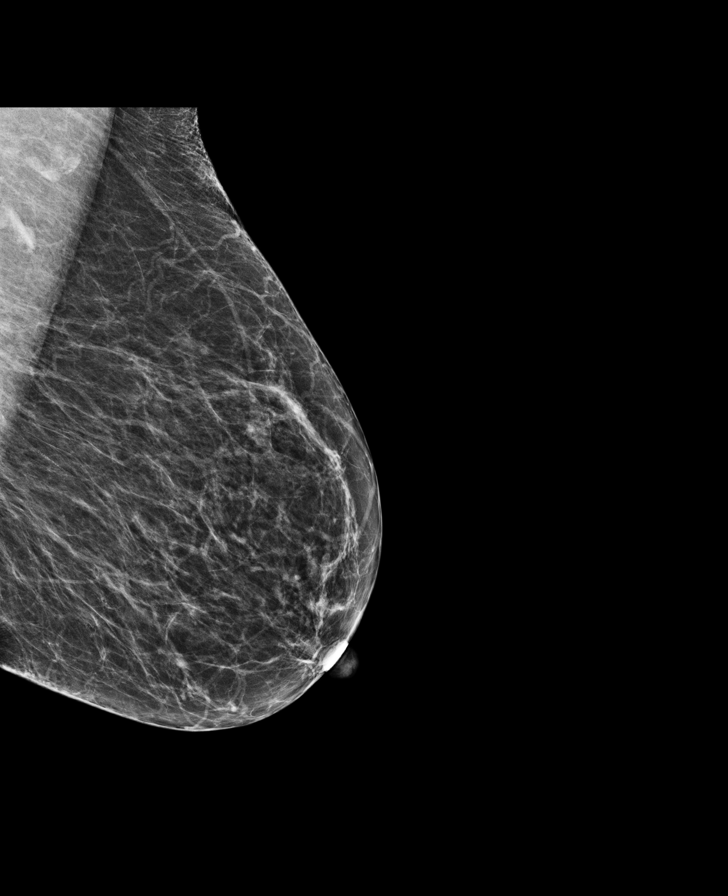

[L CC]
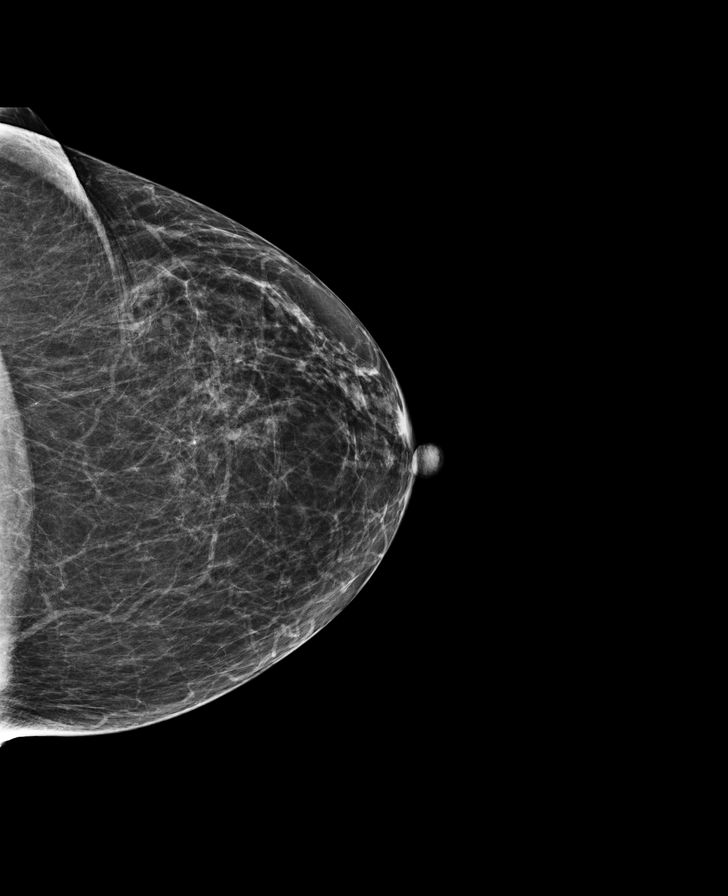

[R CC]
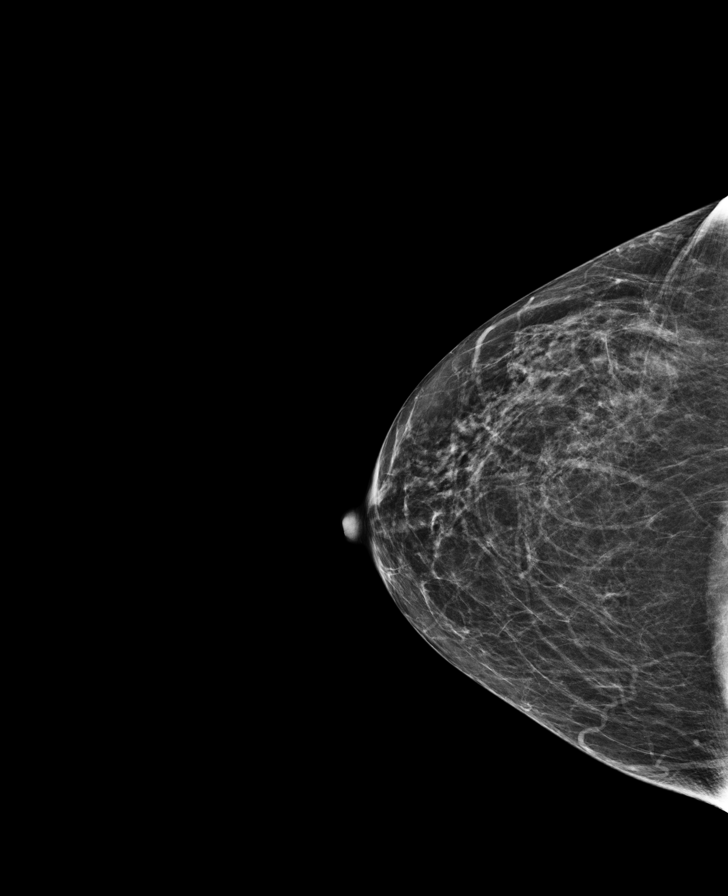

[R MLO]
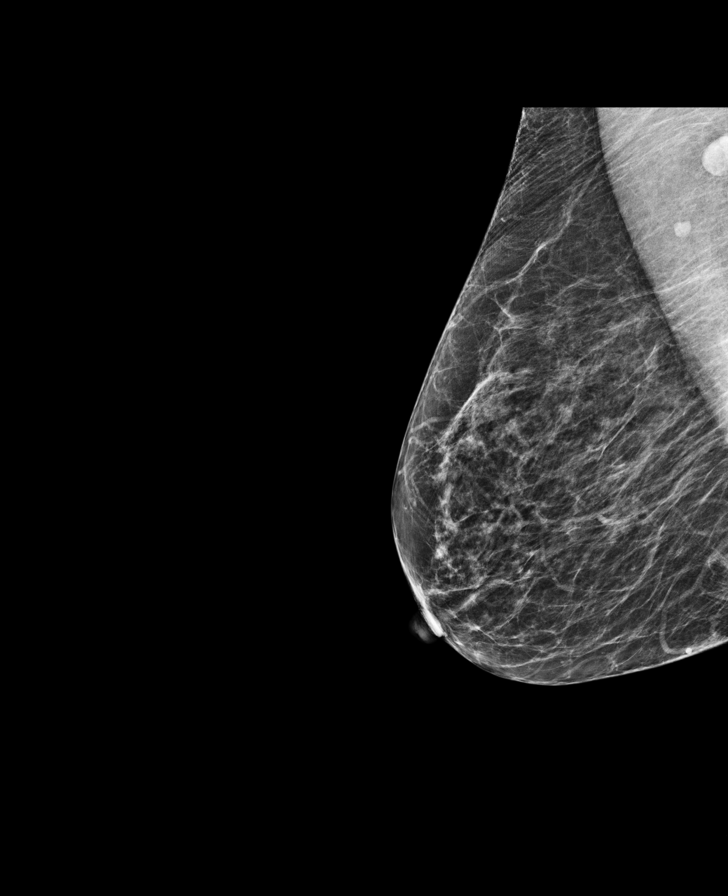

[L MLO tomo · tomo slice 29/58.0]
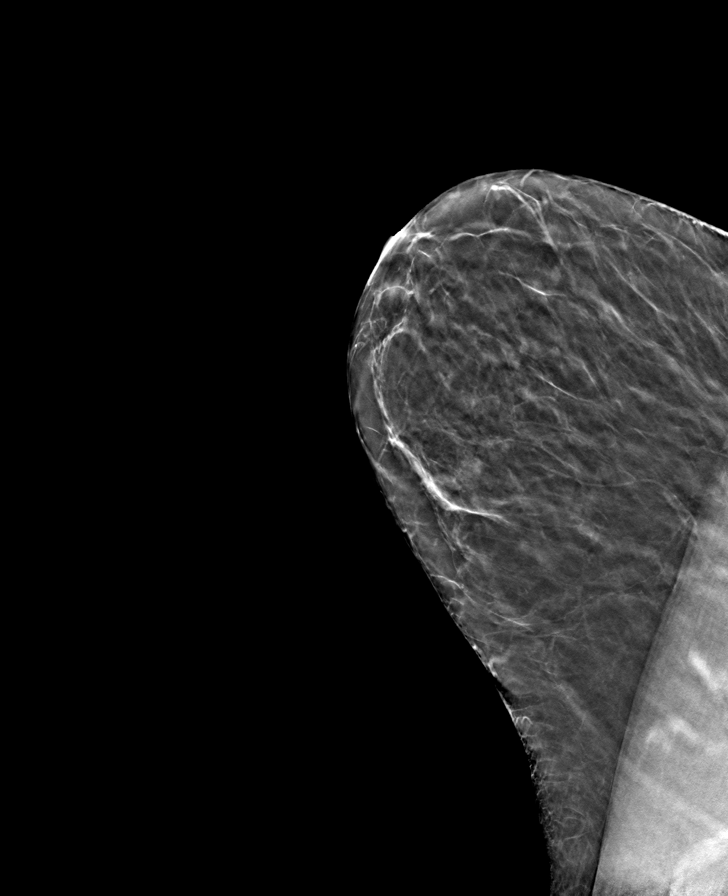

[R MLO tomo · tomo slice 31/62.0]
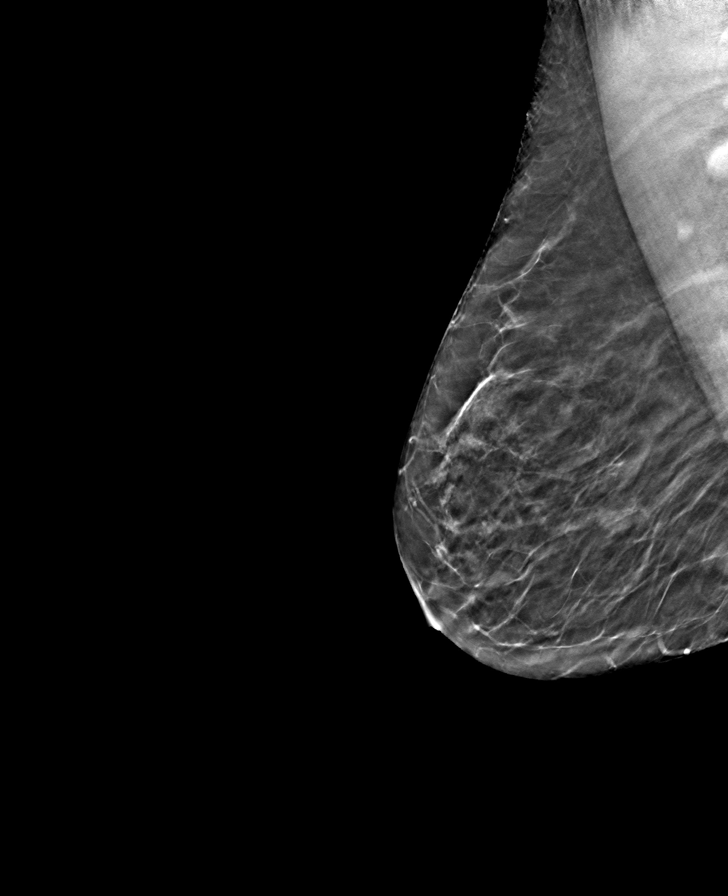

[R CC tomo · tomo slice 29/56.0]
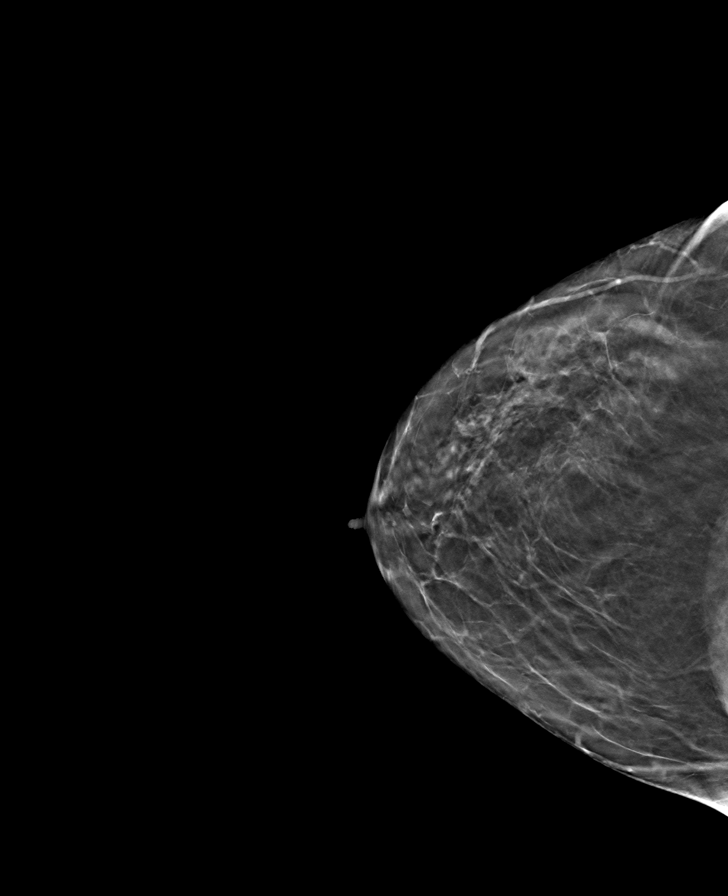

[L CC tomo · tomo slice 29/58.0]
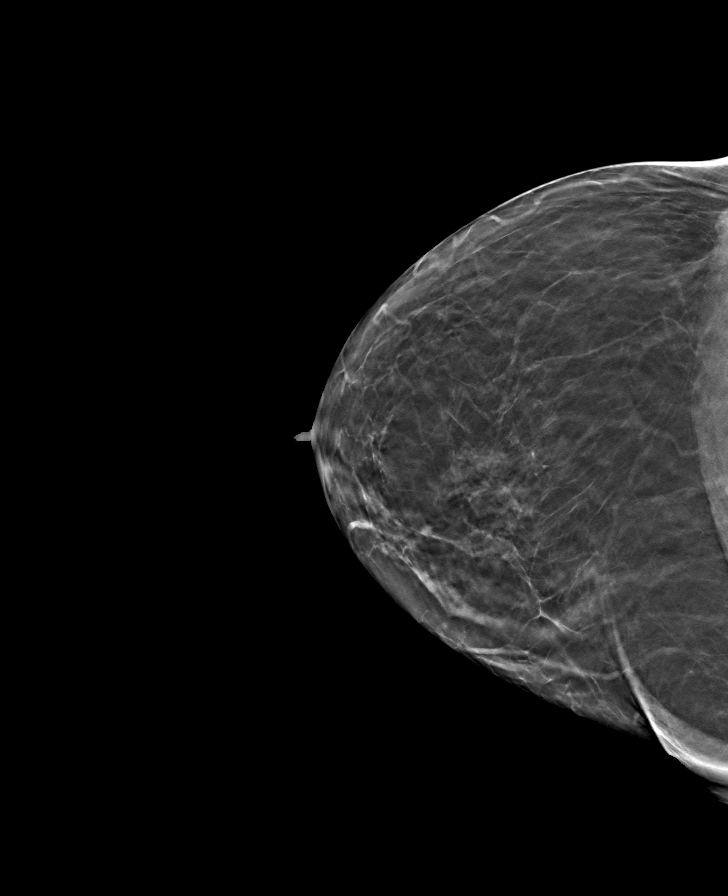

[8 of 24 positions shown; findings below may reference images not displayed]

IMPRESSION: There is no mammographic evidence of malignancy. A 1 year screening mammogram is recommended.
mwm/penrad:07/10/2015 [DATE]
letter sent: Normal
Mammogram BI-RADS: 1 Negative

## 2015-07-21 IMAGING — OT Imaging study
1 series · 1 of 1 positions shown · non-contrast
Comparison: none

REASON FOR EXAM: Post menopausal screening

RISK FACTORS:  None
PRIOR EXAMS:  DXA from [HOSPITAL] dated 01/03/2012.
METHOD:  Scans of the forearm were performed using dual energy X-ray densitometry (DXA) with the Hologic Discovery SL (S/IDMICD)  system.

[Series 1: — · left · 1 of 1 slices shown]
[im 1/1]
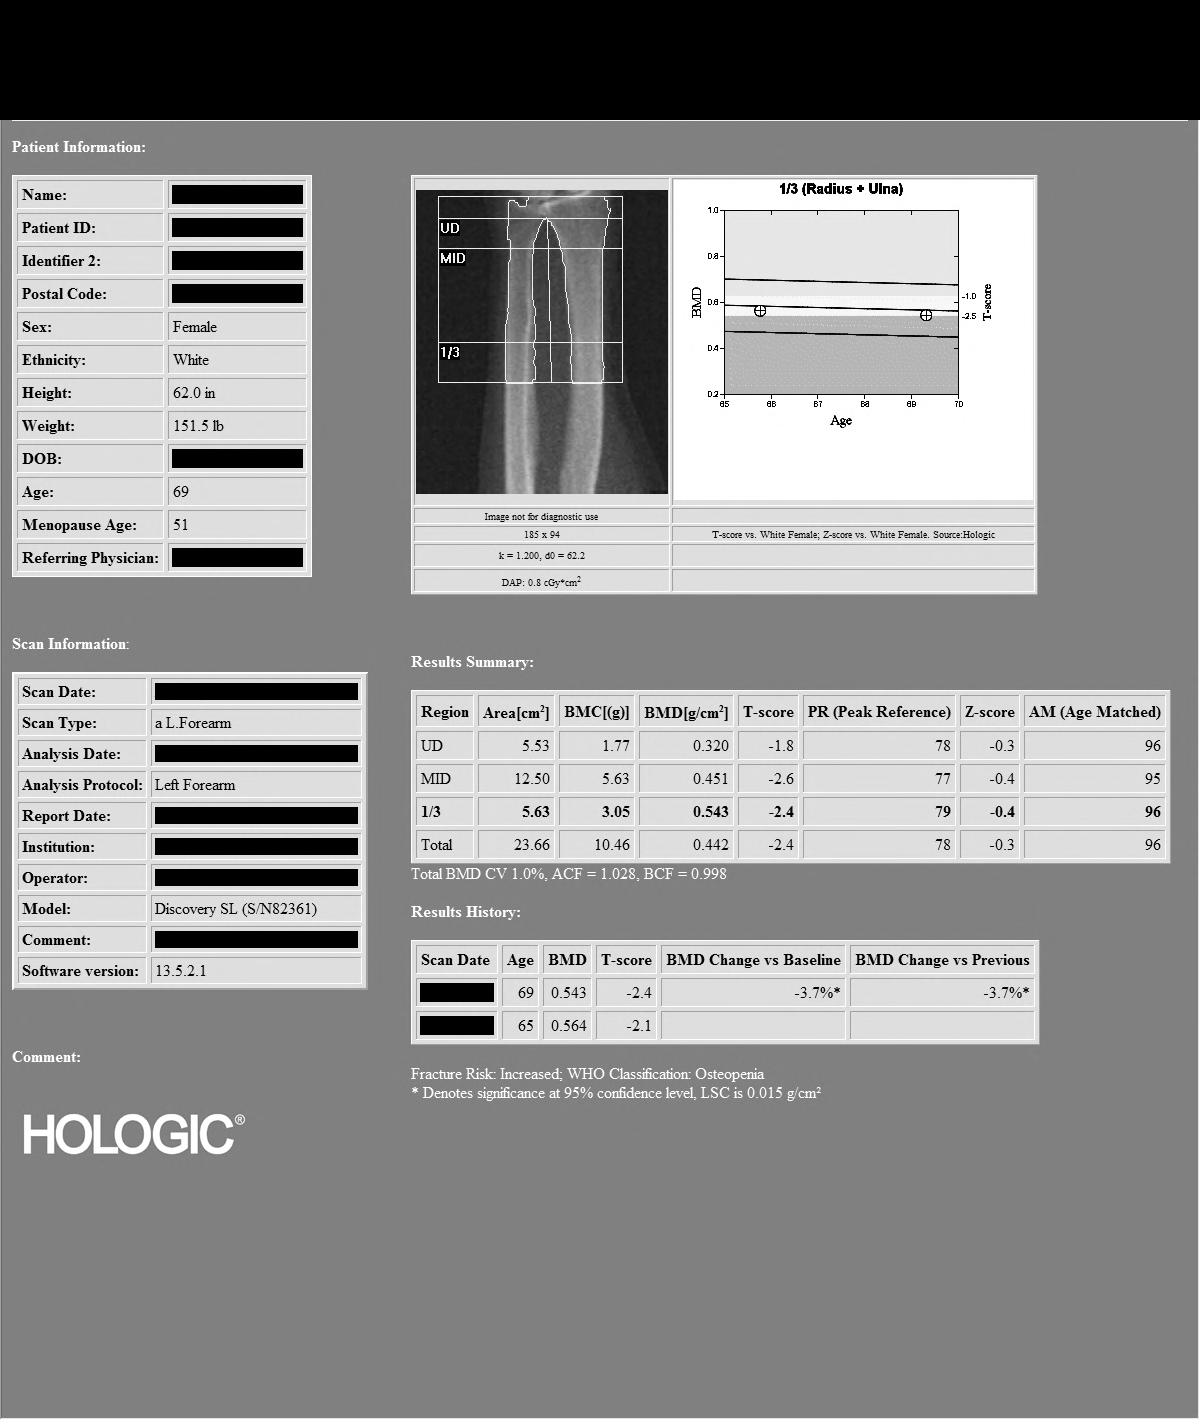

[1 of 1 positions shown; findings below may reference images not displayed]

IMPRESSION: As defined by World Health Organization, the patient meets the criteria for OSTEOPENIA based on forearm T score.

PATIENT DEMOGRAPHICS:  69 year old White female.
FINDINGS: 1.    Review of scanogram images shows no factor invalidating scan results.  The hips and spine not imaged due to hardware and surgery.

4.   The left forearm exam using [DATE] radius region of interest shows average Bone Mineral Density is 0.543 gm/cm2 of Hydroxyapatite. The T-score (comparing patient with a young adult group) is 2.4 standard deviations BELOW mean. The Z-score (comparing patient with an age-matched group) is 0.4 standard deviations BELOW mean.

COMPARED TO PRIOR DXA, forearm bone density was 0.564 gm/cm2.  This is an interval decrease of 0.021 gm/cm2 or -3.7 %. Least significant change in the forearm is 0.015 gm/cm2.  This is a statistically significant interval decrease.

RECOMMENDATIONS: In addition to assuring that the patient is taking supplemental calcium and vitamin D which the patient has indicated she IS taking, regular exercise to patient tolerance is recommended. The patient IS taking prescribed medication for prevention of bone loss. Patient is taking Fosamax.

The National Osteoporosis Foundation now recommends followup DXA scanning every two years in patients at risk regardless of whether the patient is undergoing pharmacological treatment.

World Health Organization criteria for diagnosis, please see link below.

[URL]

## 2016-09-26 IMAGING — US DOP ARTERIAL LWR EXT BIL
1 series · 14 of 16 positions shown · non-contrast
Comparison: None.

HISTORY: 70-year-old female, history peripheral vascular disease.
TECHNIQUE: Right left lower extremity arterial Doppler ultrasound.

[Series 1: dop arterial lwr ext bil · arterial · 14 of 27 slices shown]
[im 1/27]
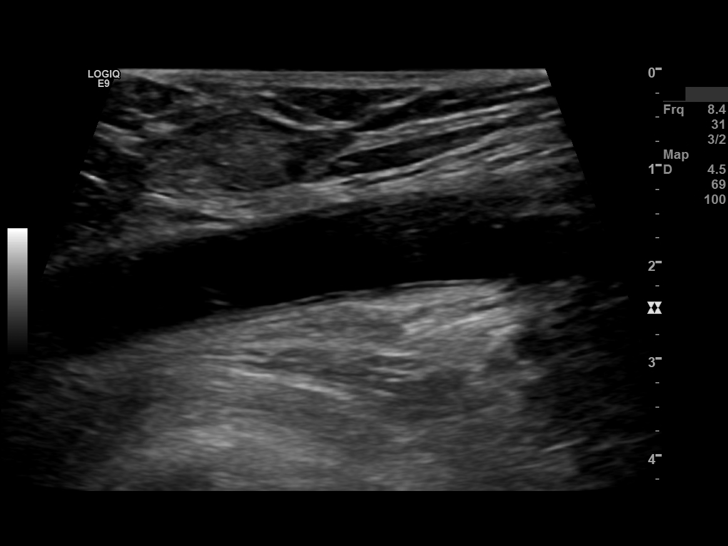
[im 2/27]
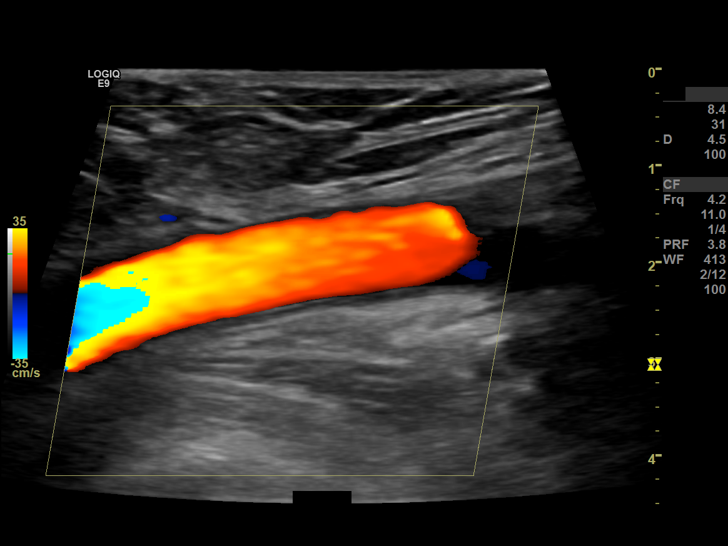
[im 4/27]
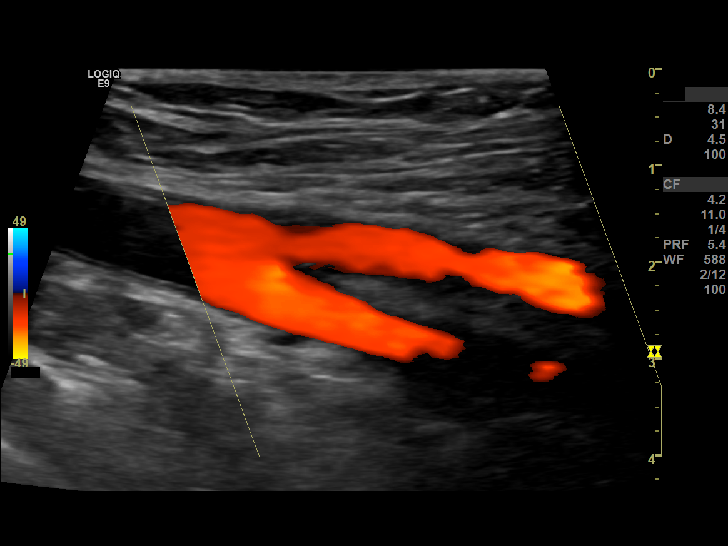
[im 7/27]
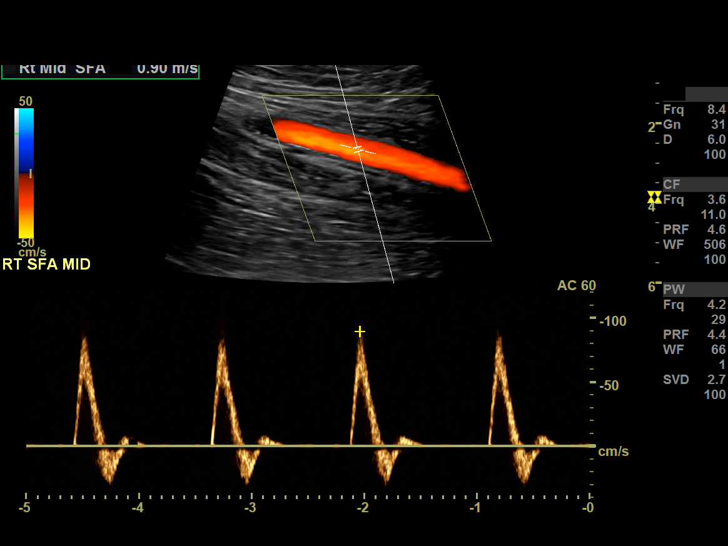
[im 9/27]
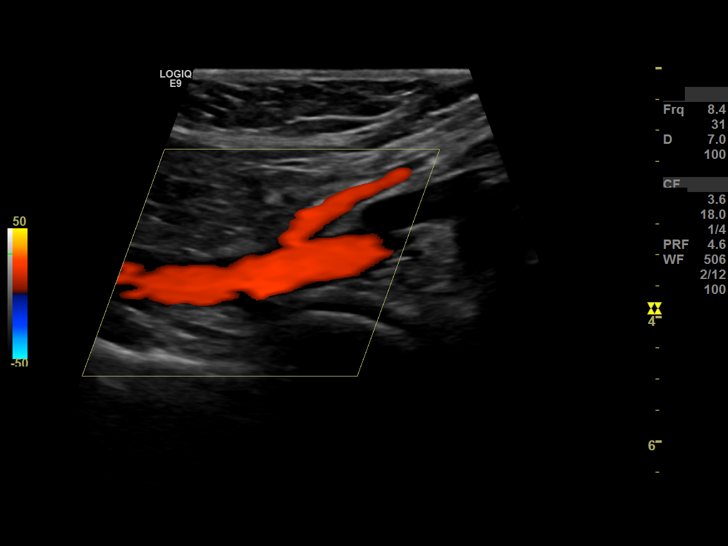
[im 11/27]
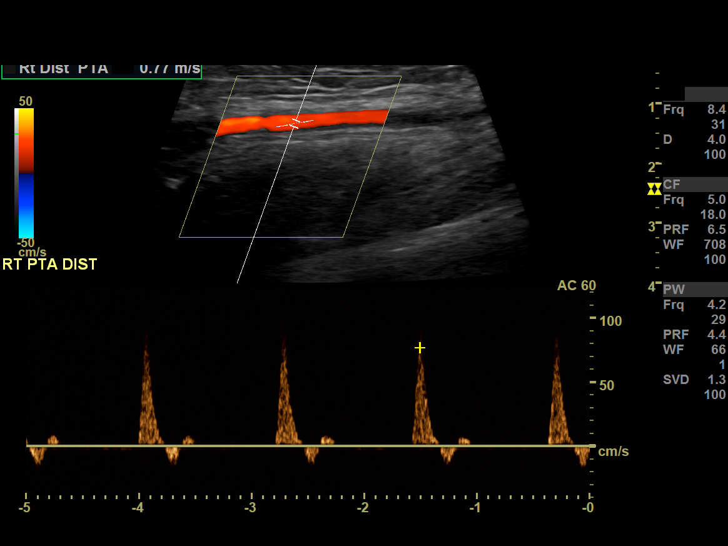
[im 13/27]
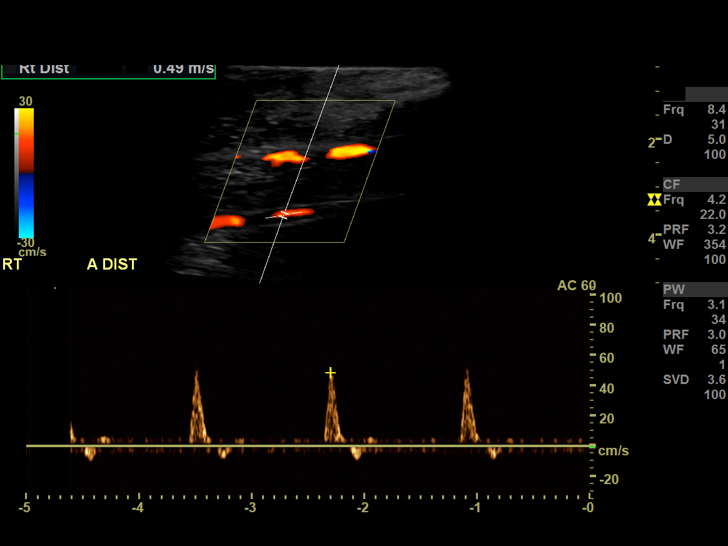
[im 14/27]
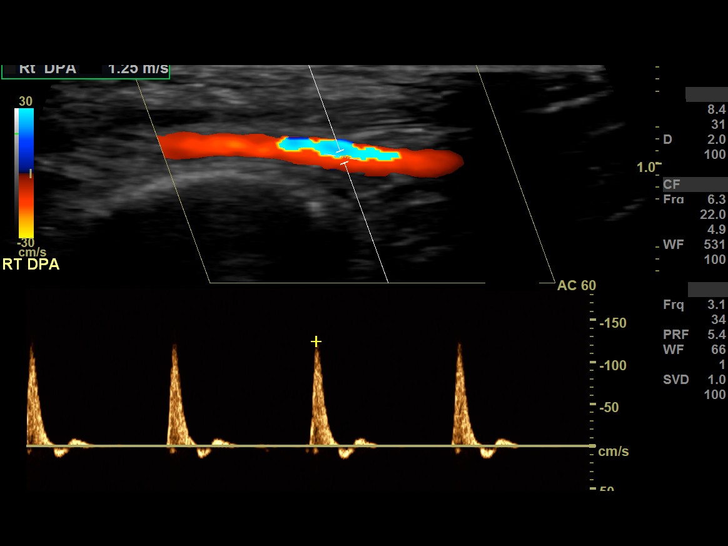
[im 16/27]
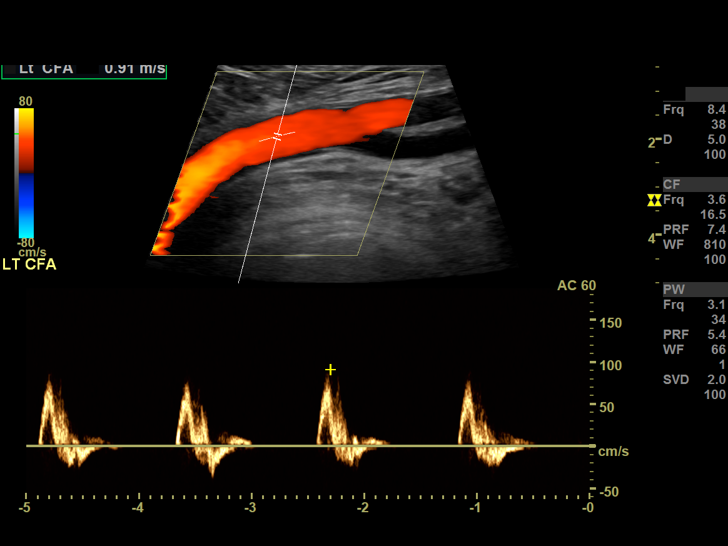
[im 18/27]
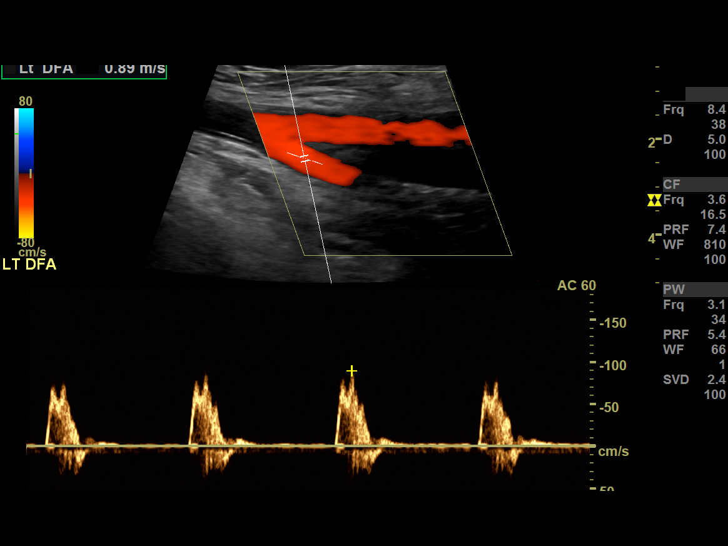
[im 21/27]
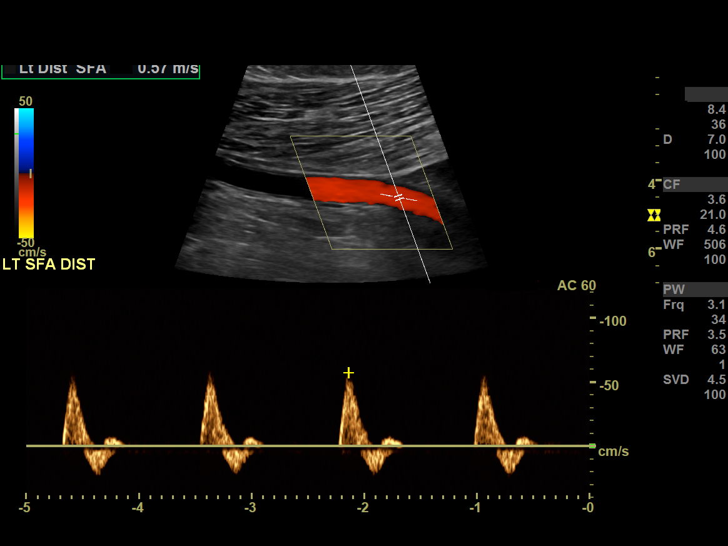
[im 23/27]
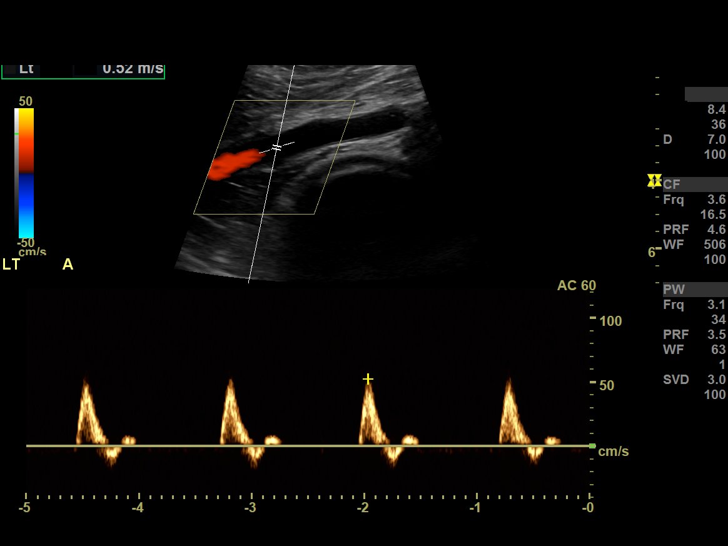
[im 25/27]
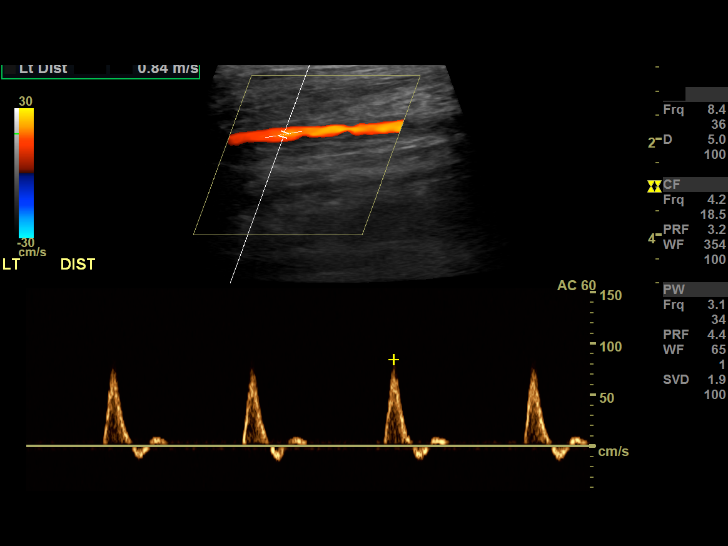
[im 27/27]
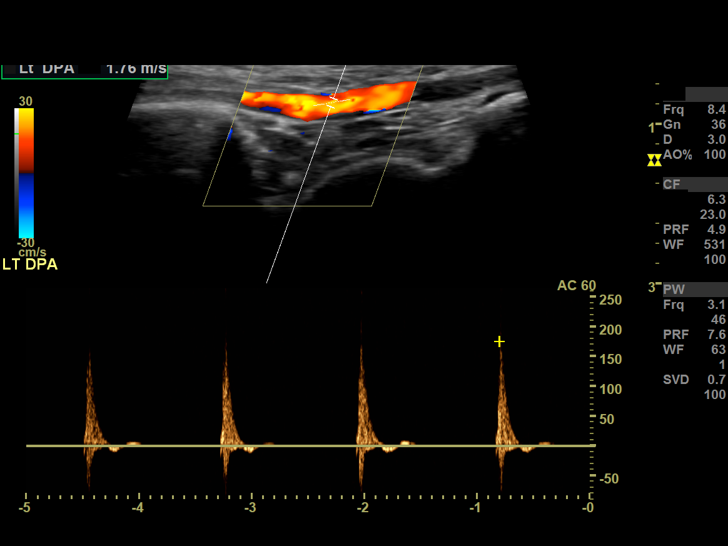

[14 of 16 positions shown; findings below may reference images not displayed]

FINDINGS: Right lower extremity: Multiphasic waveform seen from common femoral artery down into the ankle. Increased velocity of dorsalis pedis artery and triphasic waveforms can be a normal finding.

Left lower extremity: Triphasic multiphasic waveforms in common femoral artery down into the ankle. Increased velocity of dorsalis pedis with intact triphasic anterior tibial artery again is usually due to a physiological normal velocity.
IMPRESSION: No evidence of high-grade stenosis or occlusion seen.

## 2016-09-29 IMAGING — MG MAMMO SCRN BIL W/CAD TOMO
6 of 9 series · 6 of 25 positions shown · non-contrast
Comparison: none

Images Obtained from [facility]
CLINICAL RA REF: Mammo Scrn bilateral (Digital) W/Cad Routine with Tomosynthesis.
Digital images were generated from the 3D Tomosynthesis data acquired during the exam.
Comparison is made to exams dated:  07/09/2015, 05/14/2014, and 04/18/2013 [HOSPITAL] - [HOSPITAL].
The breasts are composed of scattered areas of fibroglandular density.
Current study was also evaluated with a Computer Aided Detection (CAD) system.
There is an oval lymph node with a circumscribed margin in the right breast posterior depth superior region seen on the mediolateral oblique view only.
No other significant masses, calcifications, or other findings are seen in either breast.

[L CC]
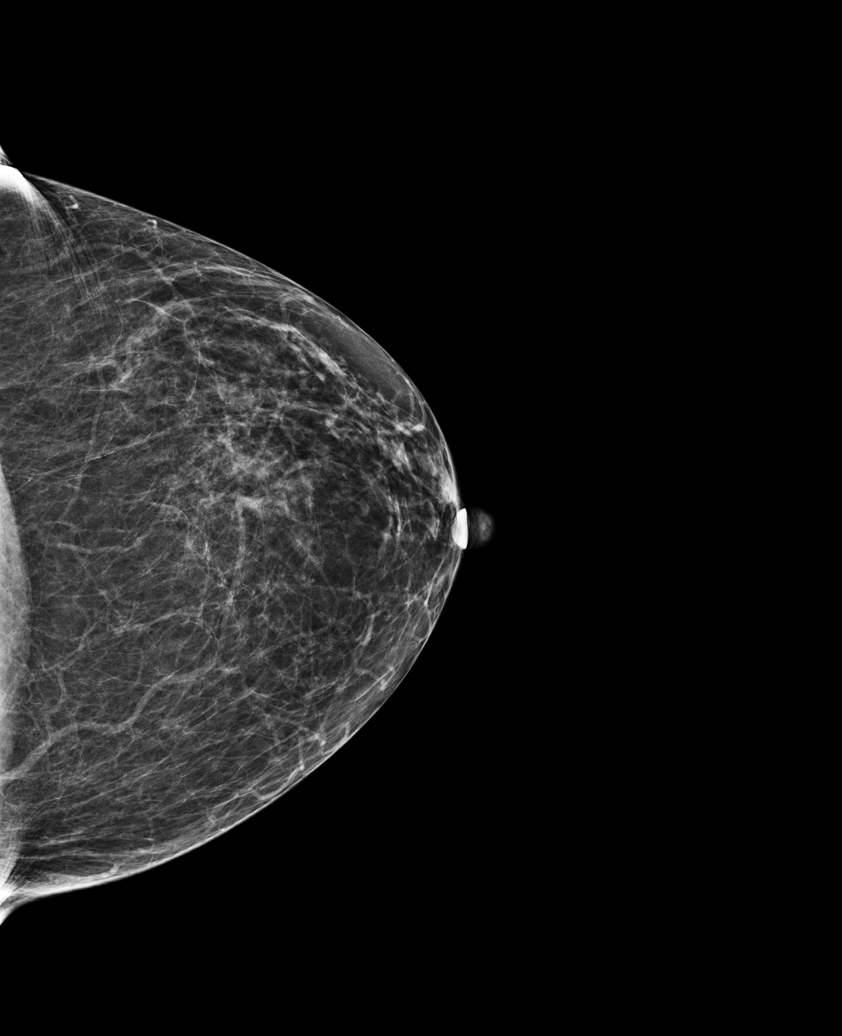

[L MLO]
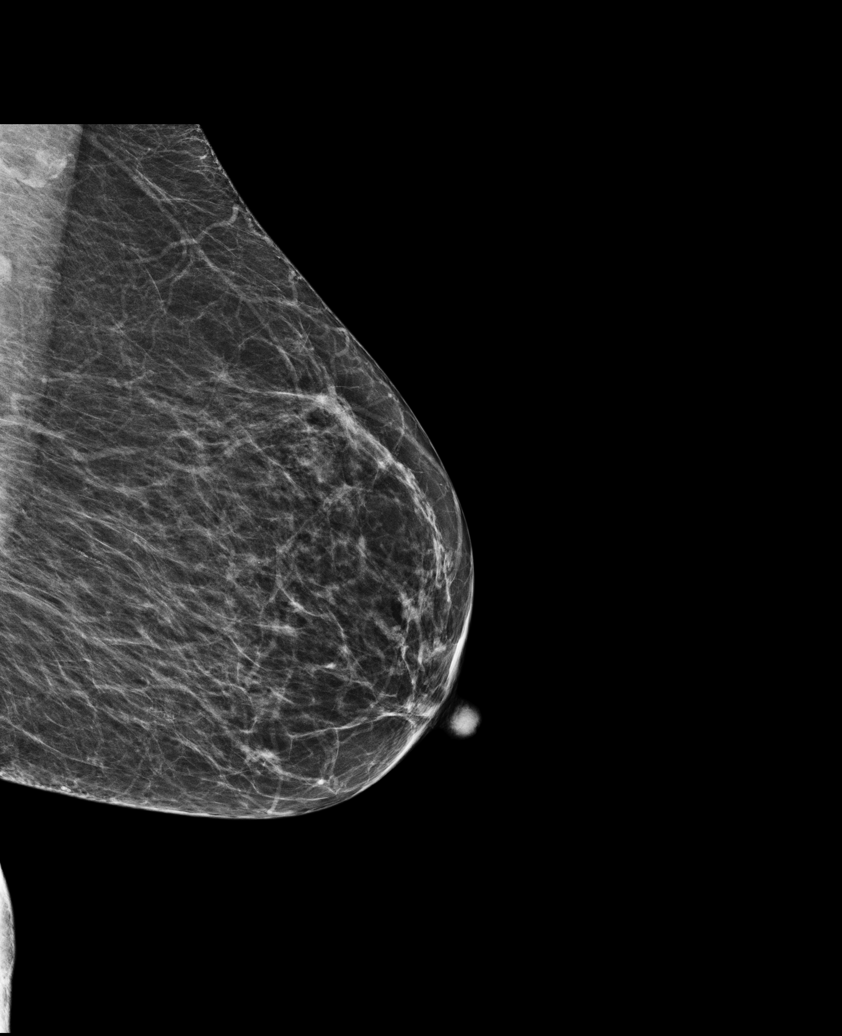

[R MLO (1 of 2)]
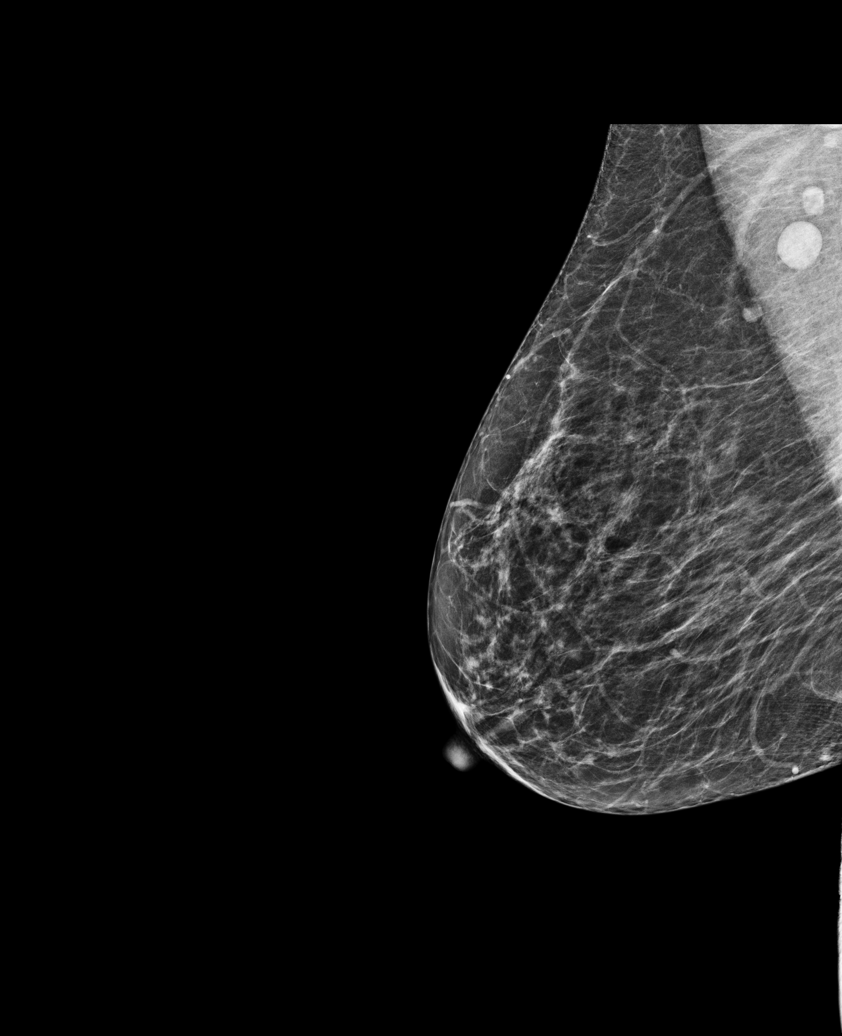

[R CC]
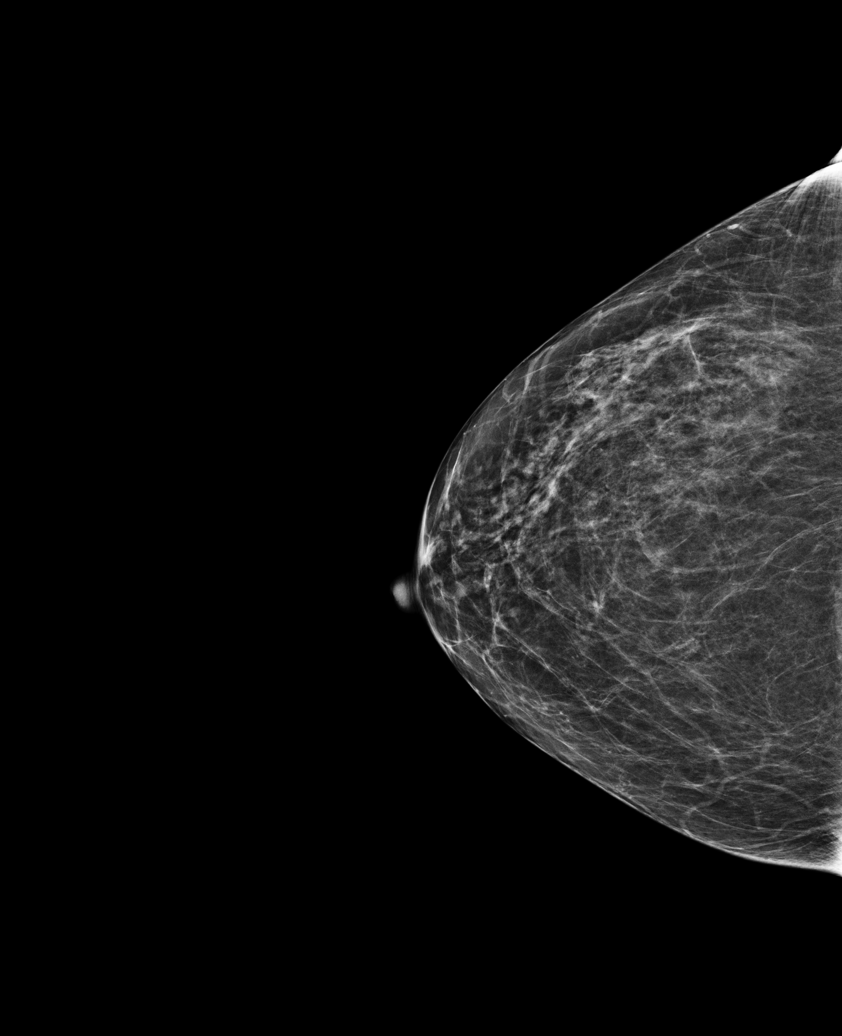

[R MLO tomo · tomo slice 28/55.0]
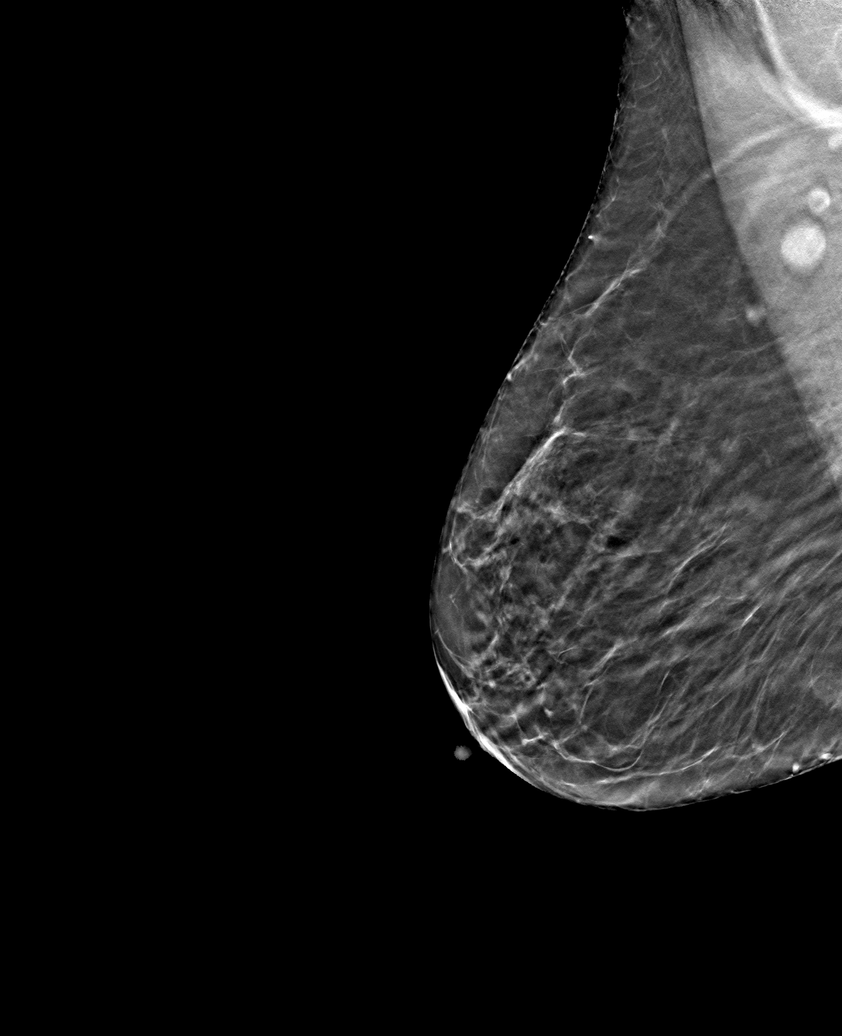

[R MLO (2 of 2)]
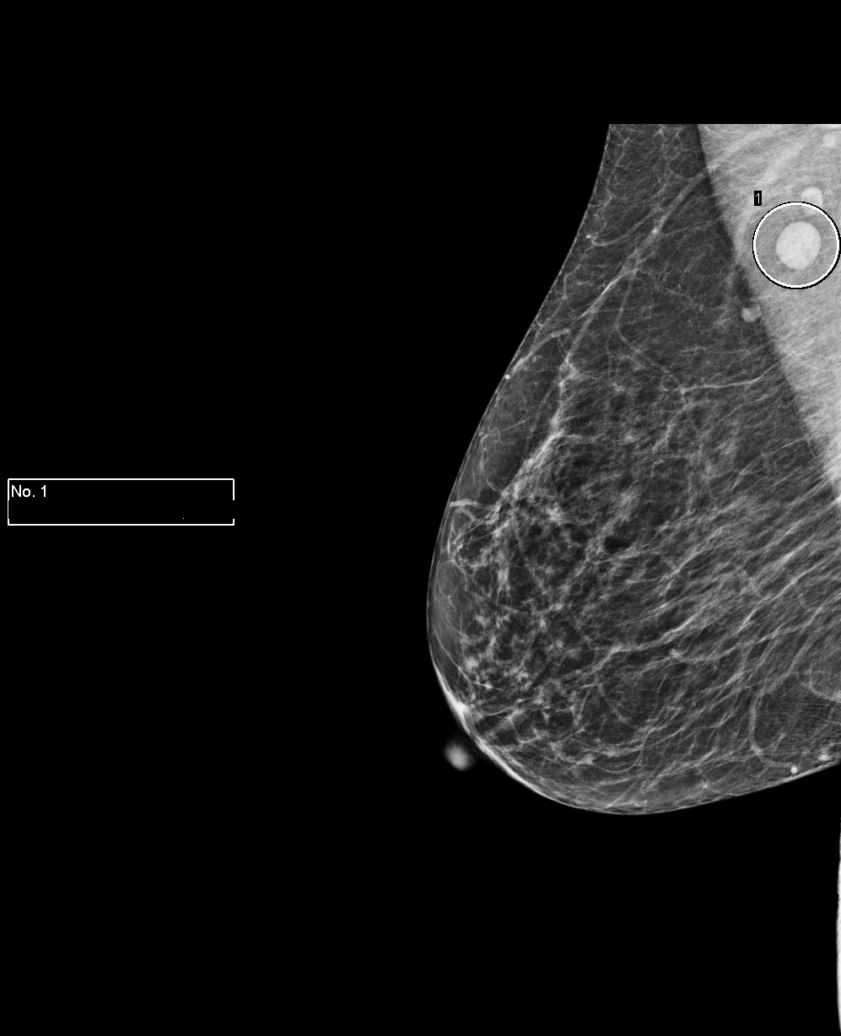

[6 of 25 positions shown; findings below may reference images not displayed]

IMPRESSION: INCOMPLETE:  NEED ADDITIONAL IMAGING EVALUATION
The oval lymph node in the right breast is indeterminate.  Ultrasound with possible additional views are recommended.
mc/penrad:09/30/2016 [DATE]
letter sent: Birad 0
FINAL ASSESSMENT: BI-RADS:Category 0: Incomplete:  Need Additional Imaging Evaluation

## 2016-10-05 IMAGING — US US THYROID
1 series · 14 of 25 positions shown · non-contrast
Comparison: None.

HISTORY: Thyroiditis.
TECHNIQUE: US THYROID

[Series 1: us thyroid · 14 of 30 slices shown]
[im 1/30]
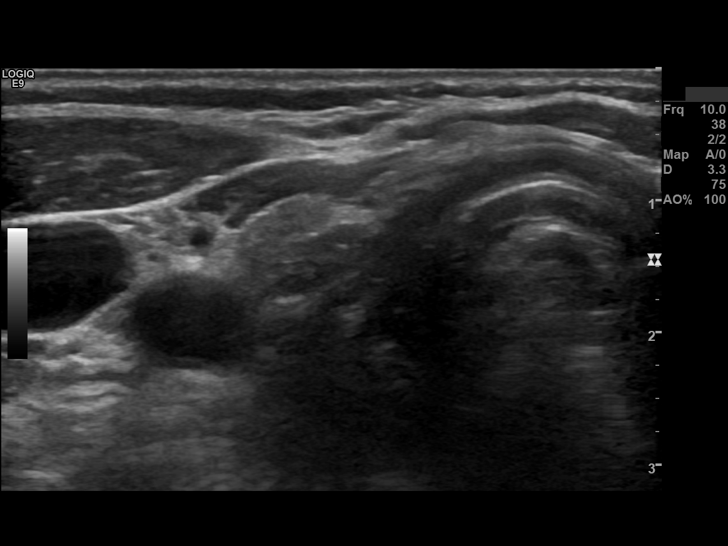
[im 3/30]
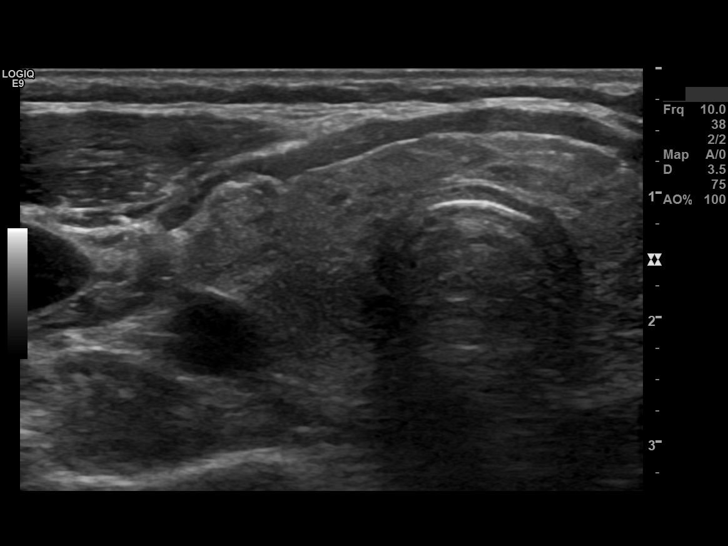
[im 5/30]
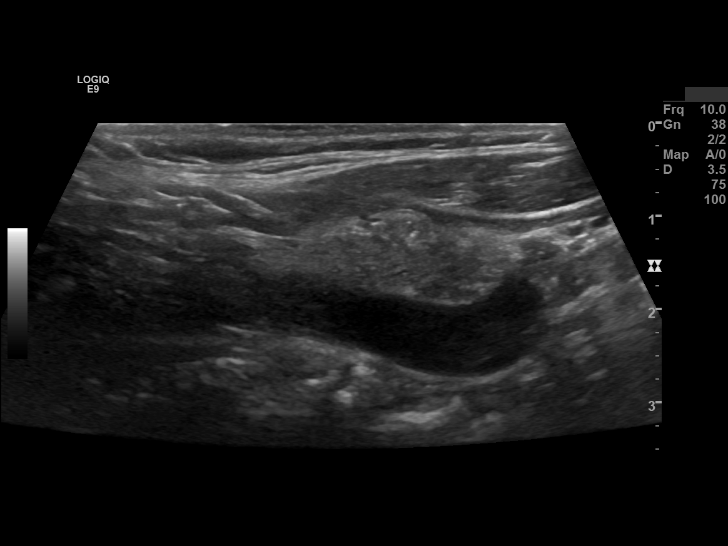
[im 8/30]
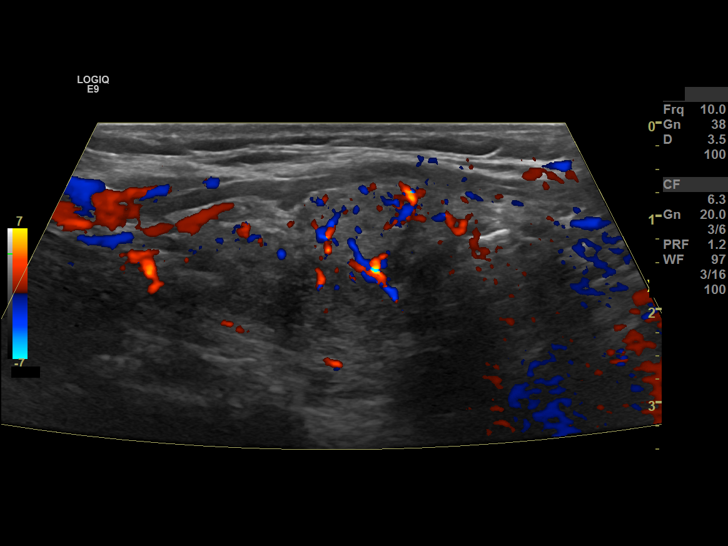
[im 10/30]
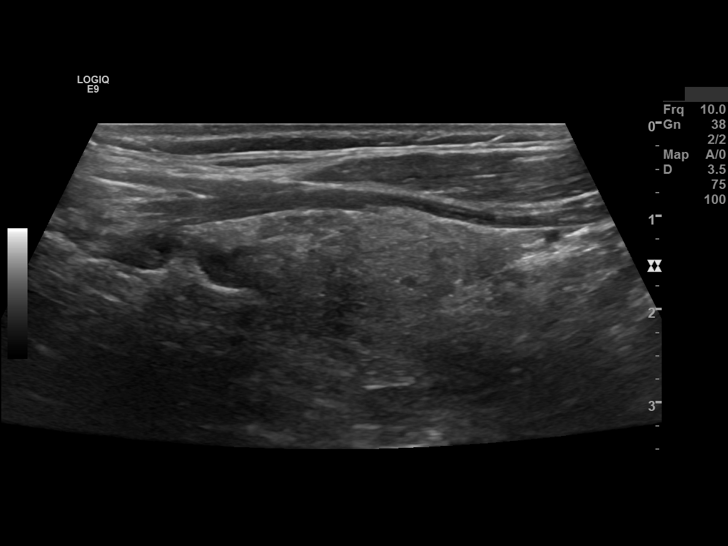
[im 11/30]
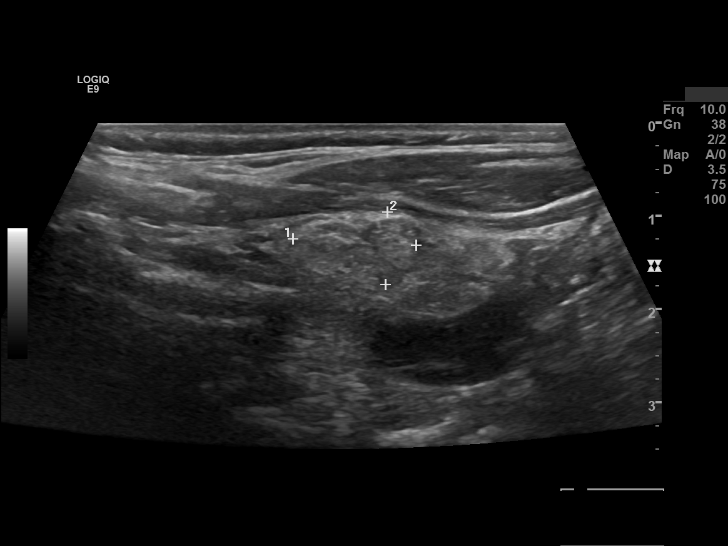
[im 14/30]
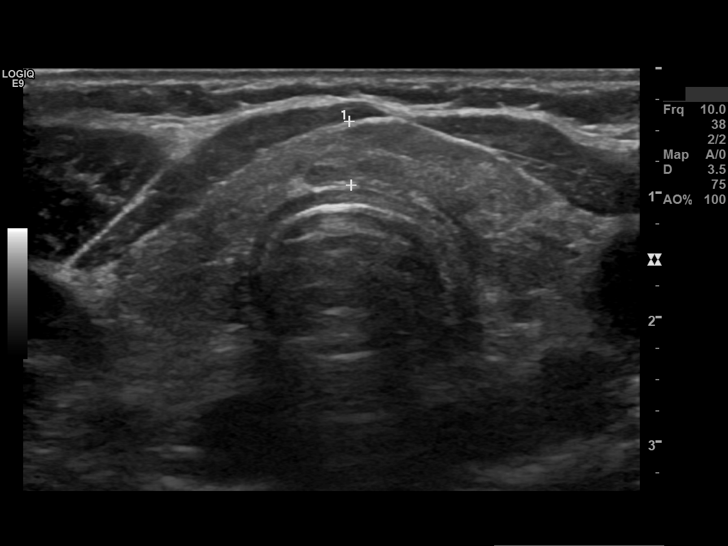
[im 16/30]
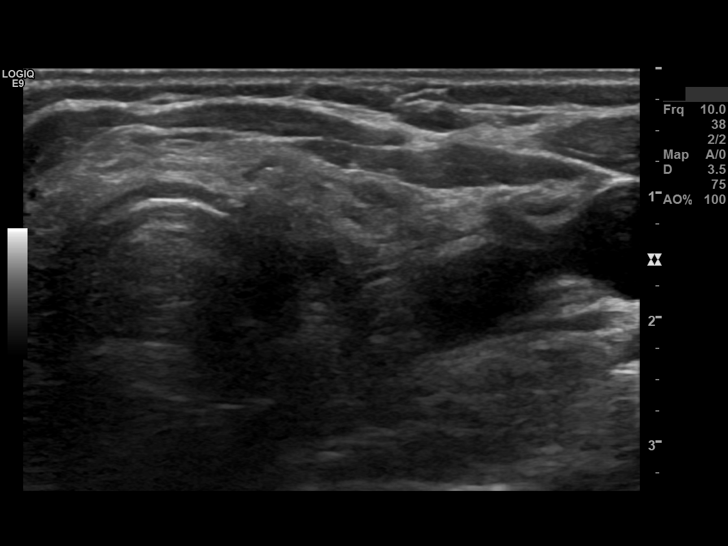
[im 19/30]
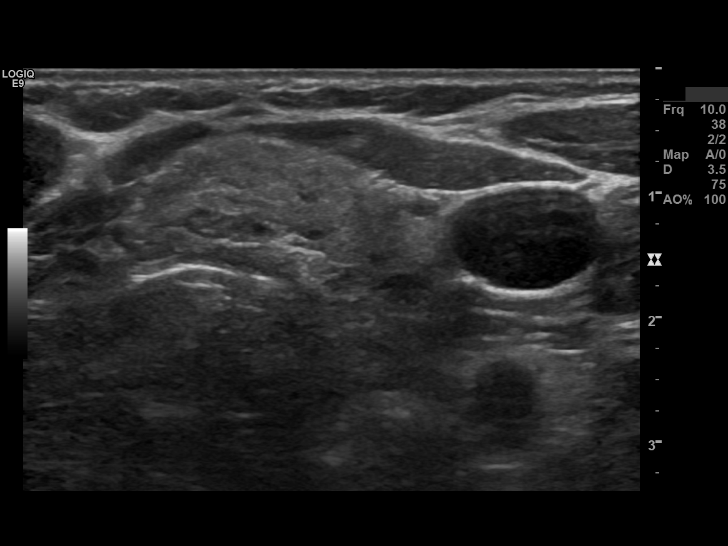
[im 20/30]
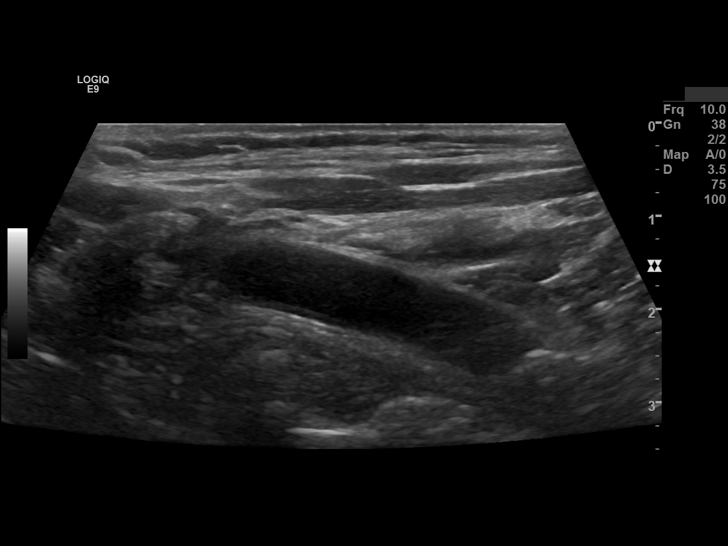
[im 22/30]
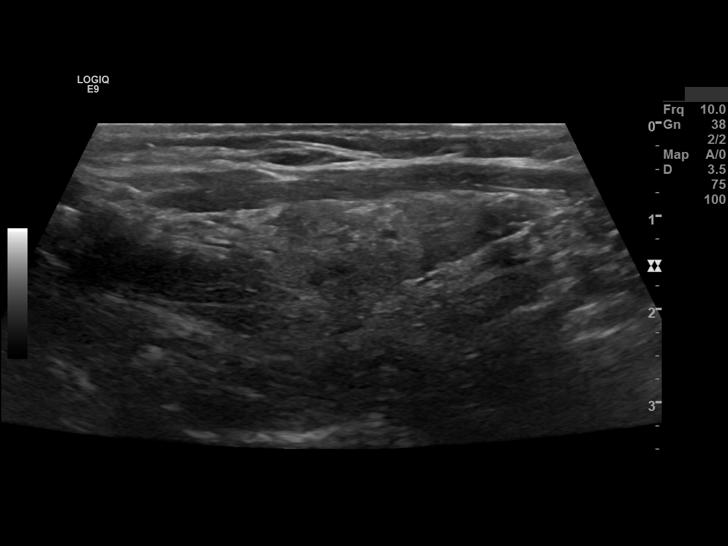
[im 25/30]
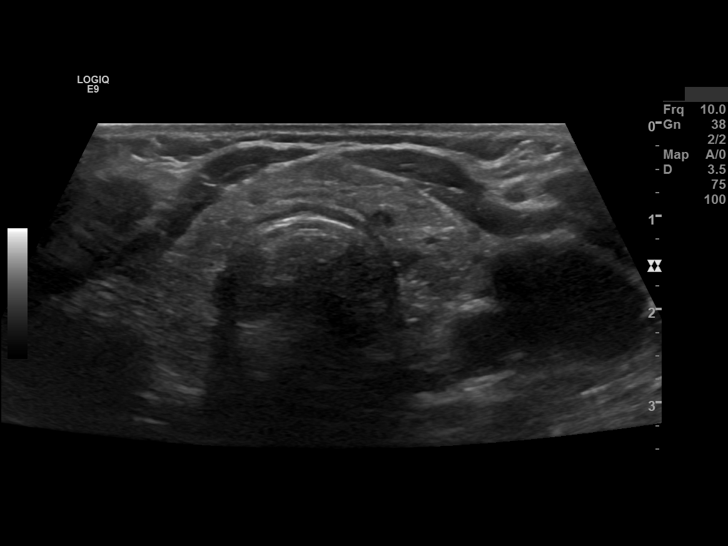
[im 27/30]
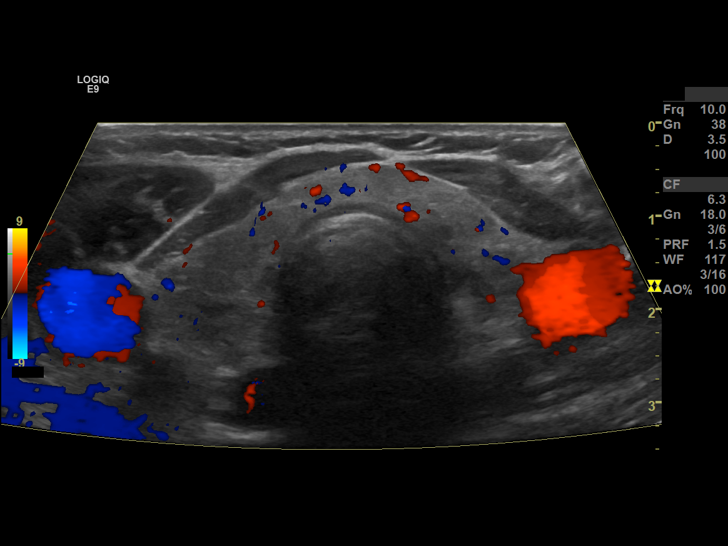
[im 30/30]
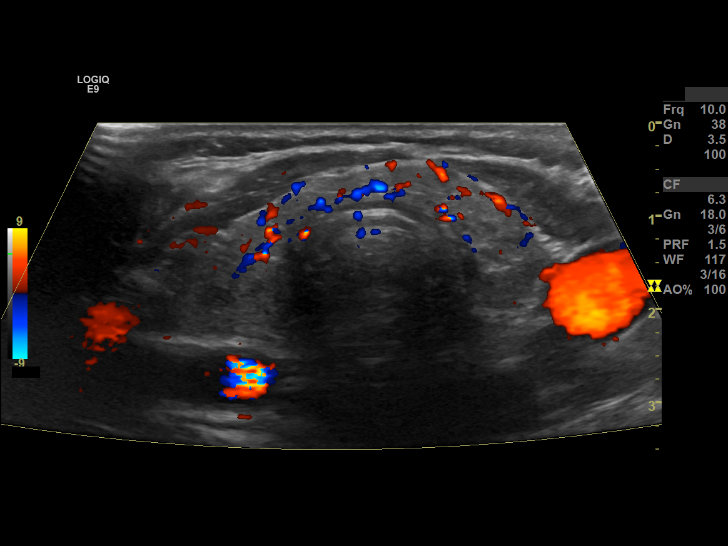

[14 of 25 positions shown; findings below may reference images not displayed]

FINDINGS: Right lobe of thyroid 40 x 17 x 16 mm with one solid nodule at mid pole 13 x 8 x 8 mm. The remainder of the gland is fairly normal in echotexture without other focal lesions. The isthmus is 5 mm.

The left lobe is 35 x 11 x 13 mm, slightly inhomogeneous with no focal lesions.
IMPRESSION: Solitary 13 x 8 x 8 mm nodule, right lobe of the thyroid. Otherwise, normal-appearing thyroid. Consider followup ultrasound to evaluate for growth.

STAT Fax

## 2016-10-08 IMAGING — US US RENAL RETRO COMPLETE
1 series · 14 of 19 positions shown · non-contrast
Comparison: None.

HISTORY: Chronic kidney disease, stage 3.
TECHNIQUE: Retroperitoneal ultrasound .

[Series 1: us renal retro complete · 14 of 19 slices shown]
[im 1/19]
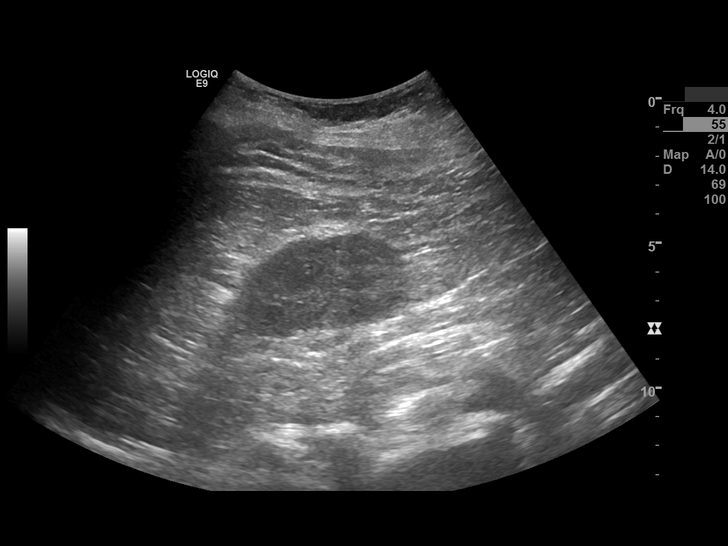
[im 3/19]
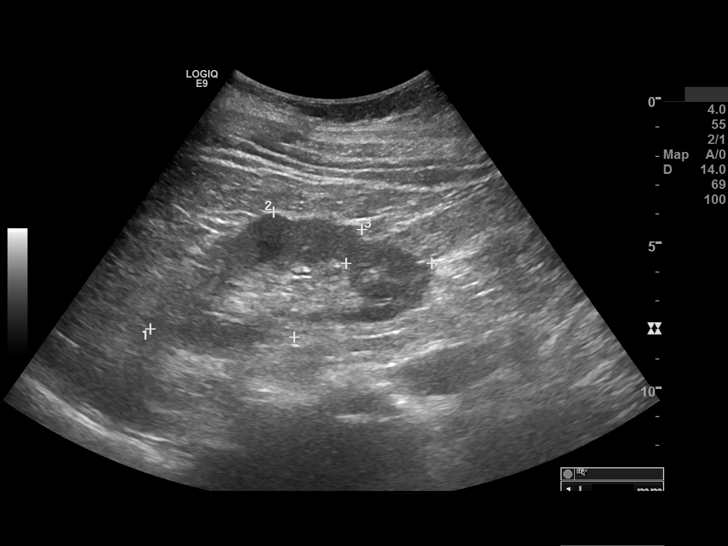
[im 4/19]
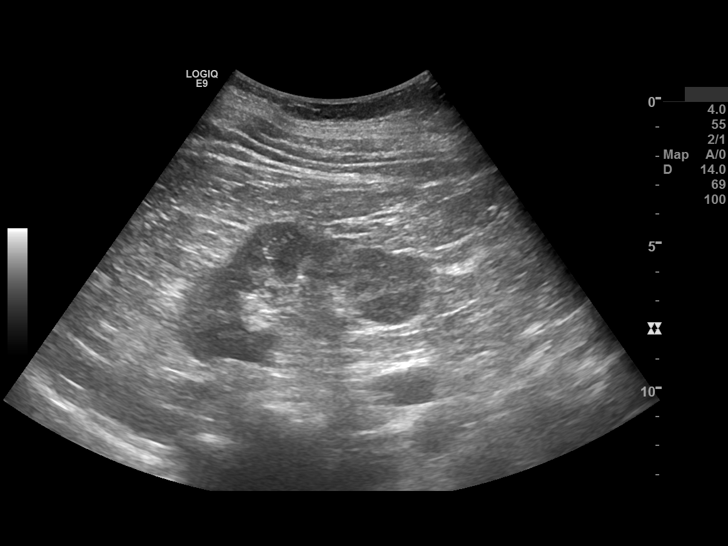
[im 5/19]
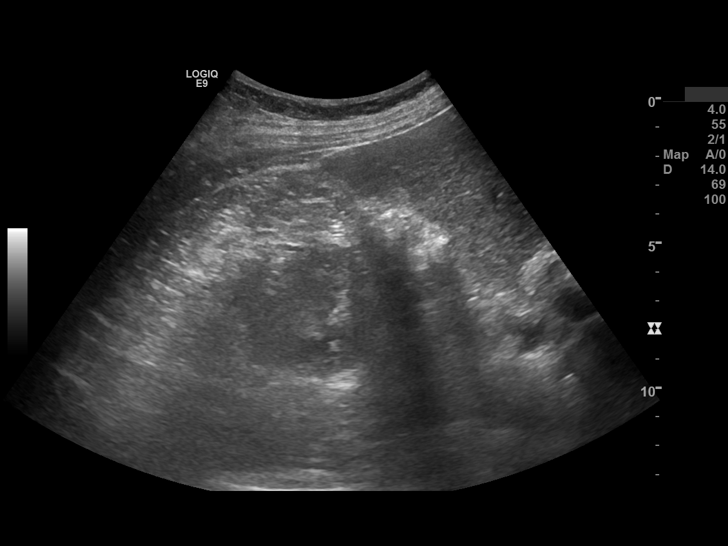
[im 7/19]
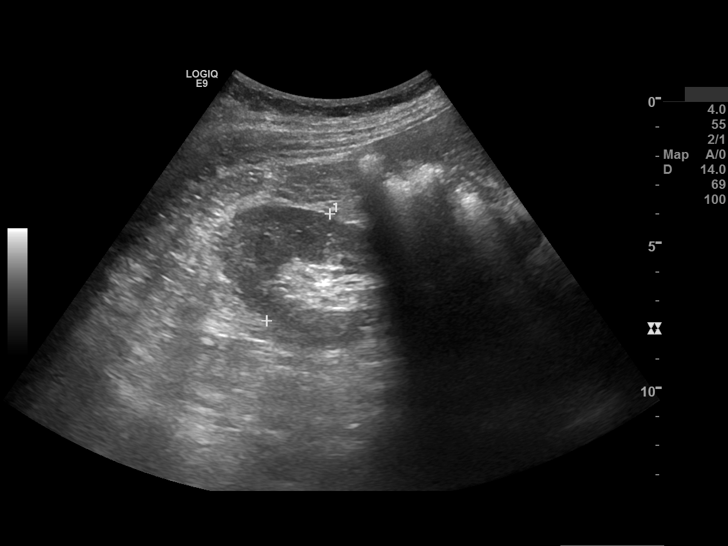
[im 8/19]
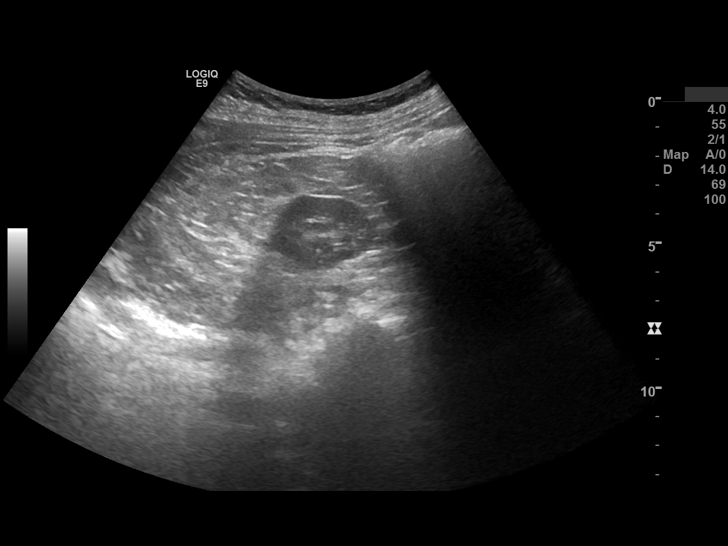
[im 9/19]
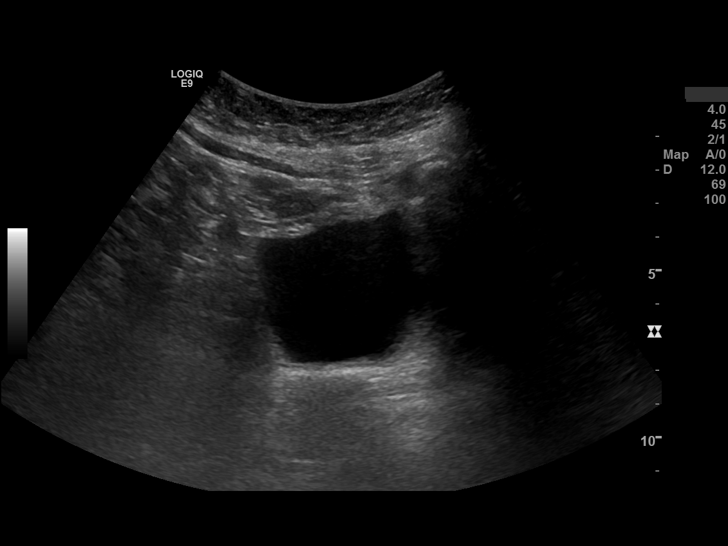
[im 11/19]
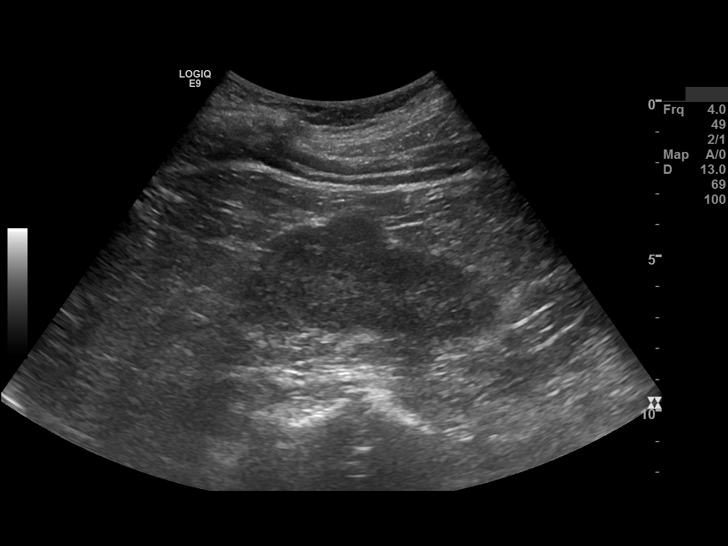
[im 12/19]
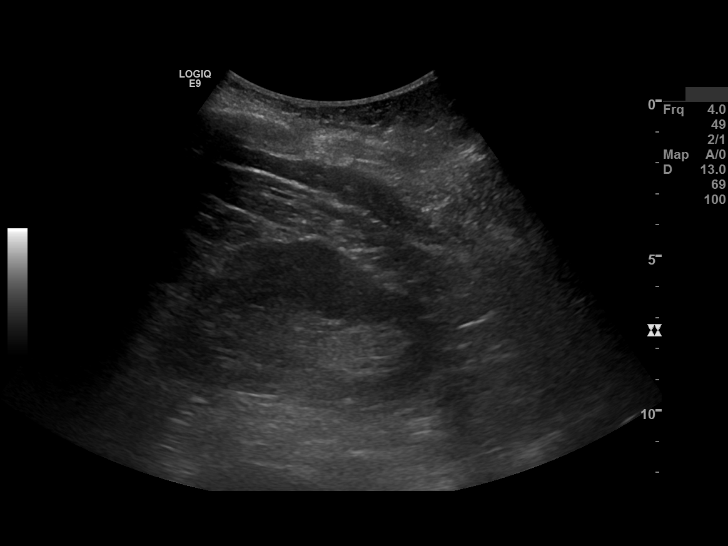
[im 13/19]
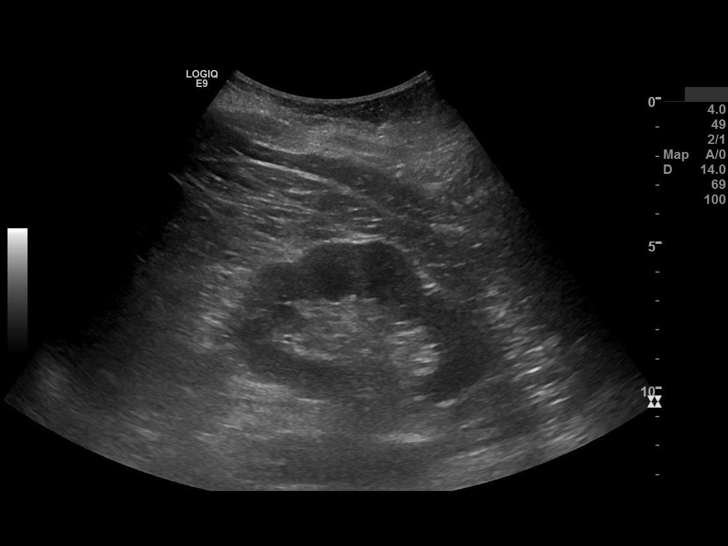
[im 15/19]
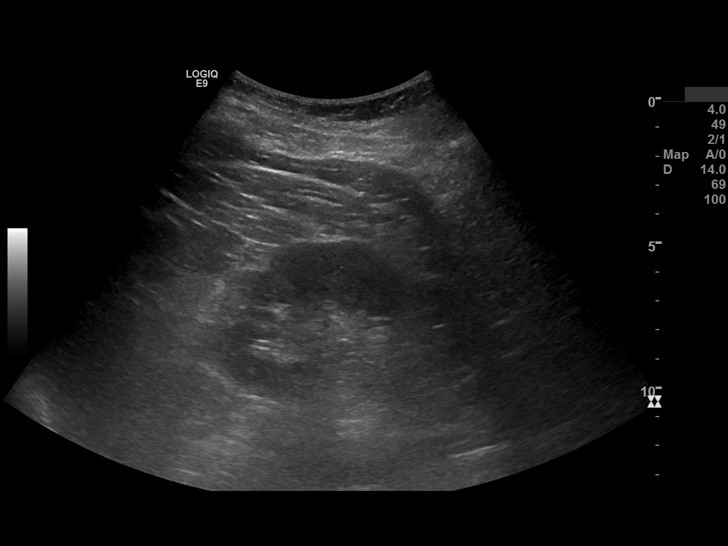
[im 16/19]
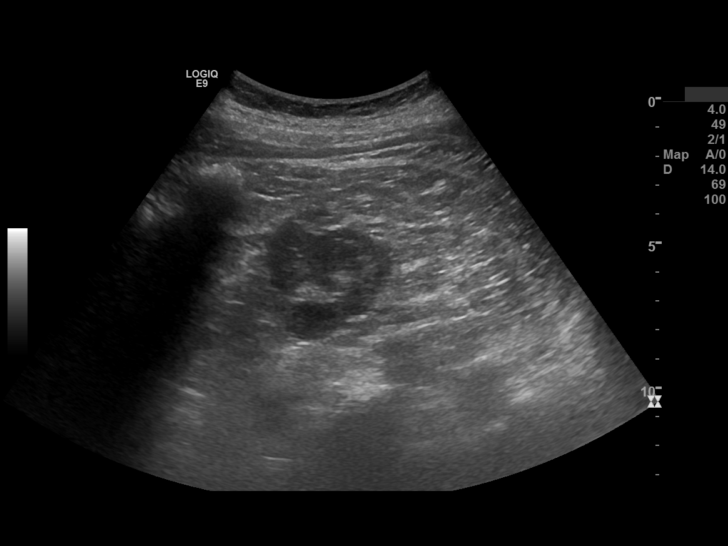
[im 17/19]
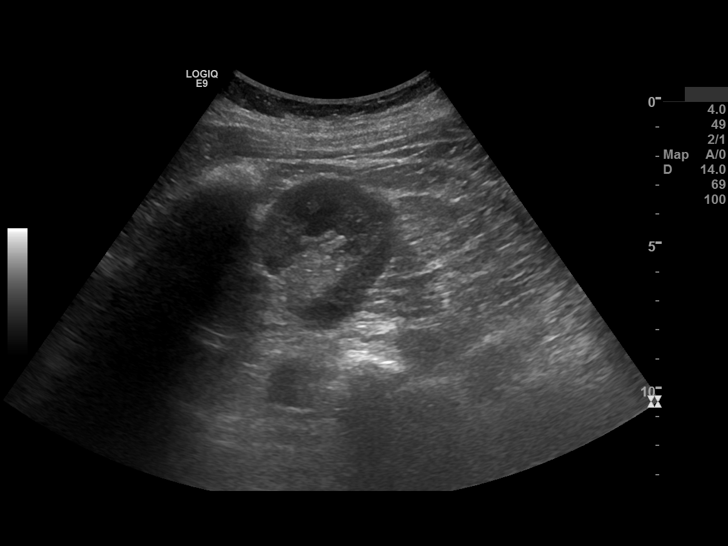
[im 19/19]
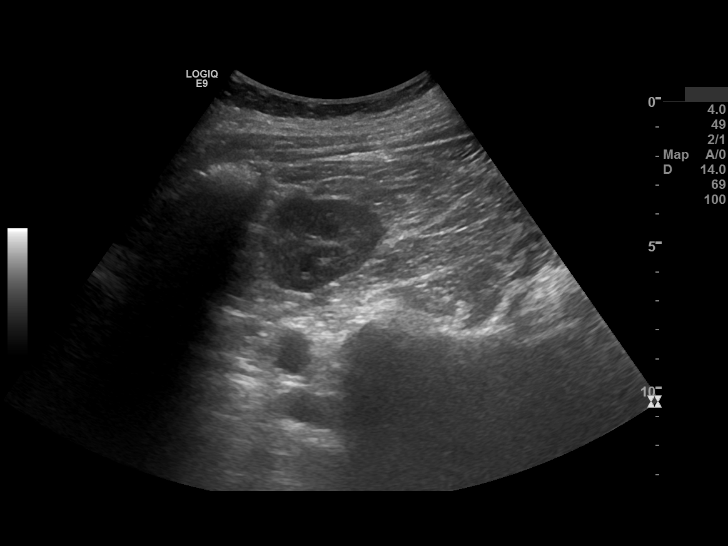

[14 of 19 positions shown; findings below may reference images not displayed]

FINDINGS: The right kidney measures 10 x 4.4 x 4.3  cm with renal cortex measuring about 13 mm.. 

The left kidney measures 9.4 x 5.6 x 4.6 cm with renal cortex measuring about 15 mm..

There is no pelvicaliectasis.  Corticomedullary differentiation is normal.   There are no focal lesions or calculi seen.  There is no cortical thinning or scarring.

The urinary bladder is within normal limits.
IMPRESSION: Unremarkable retroperitoneal ultrasound.

The referring physician office has been called and left the message.

## 2016-10-14 IMAGING — US US BREAST RT LTD
1 series · 10 of 10 positions shown · non-contrast
Comparison: none

Images Obtained from [facility]
CLINICAL RA REF: Right Ultrasound exam, follow-up to abnormal mammogram.
Comparison is made to exams dated:  09/29/2016 and 07/09/2015 [HOSPITAL] - [HOSPITAL].
Real-time and Doppler ultrasound of the right breast were performed.
There is a benign 1.1 cm oval lymph node in the right axillary tail.  This oval lymph node is hypoechoic with fatty hilum.  This correlates with mammography findings.

[Series 2: us breast right ltd · 10 of 10 slices shown]
[im 1/10]
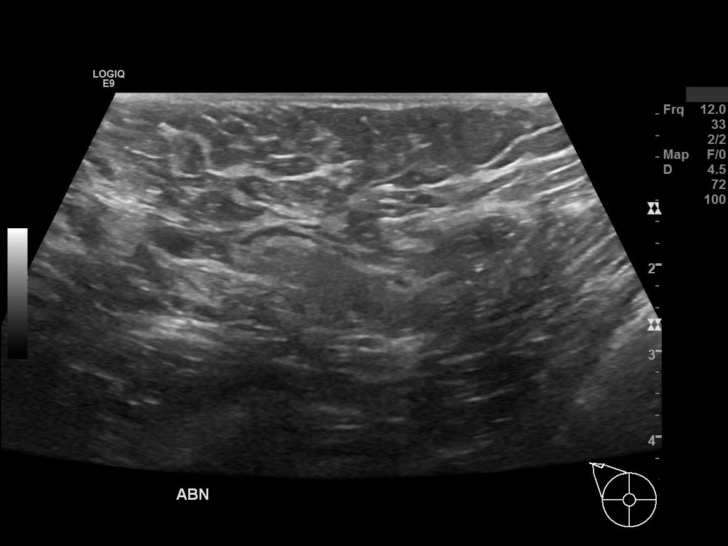
[im 2/10]
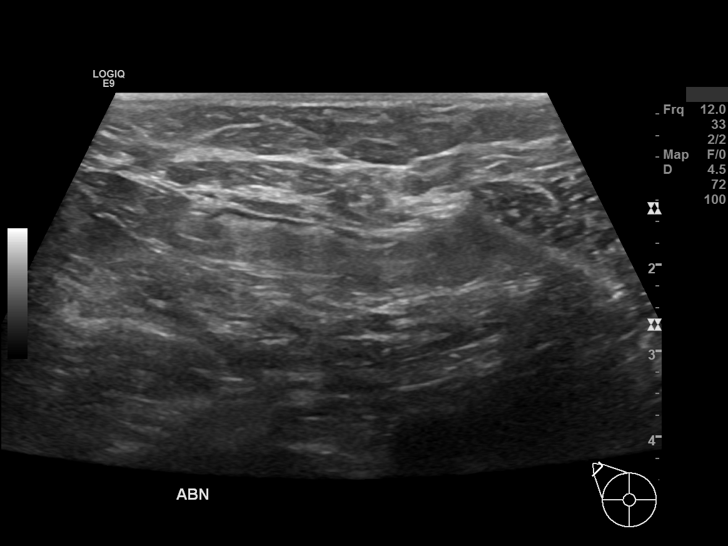
[im 3/10]
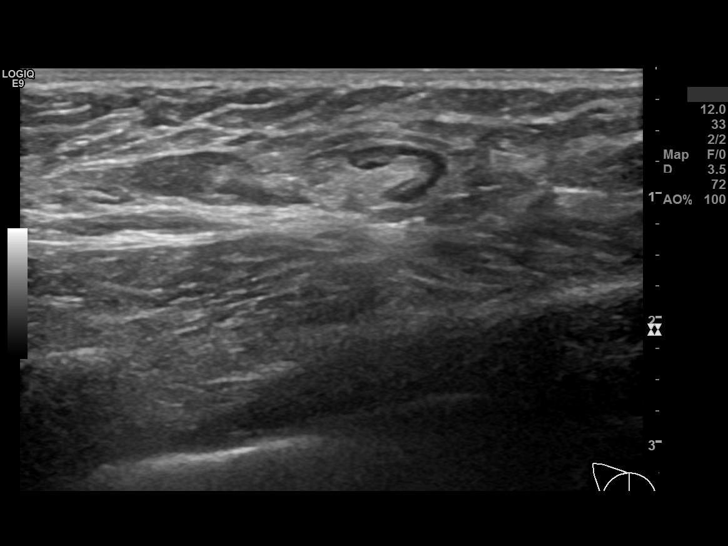
[im 4/10]
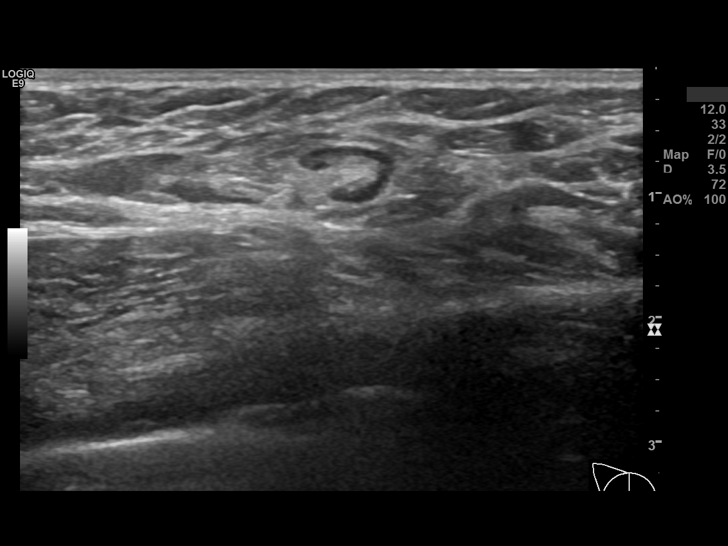
[im 5/10]
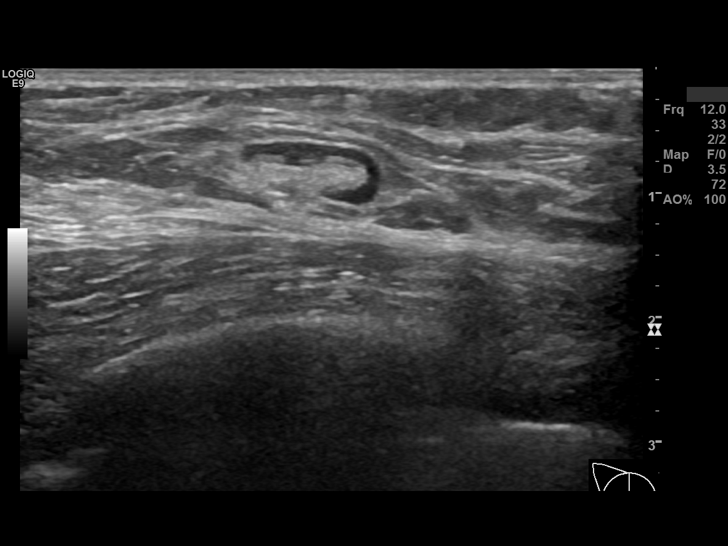
[im 6/10]
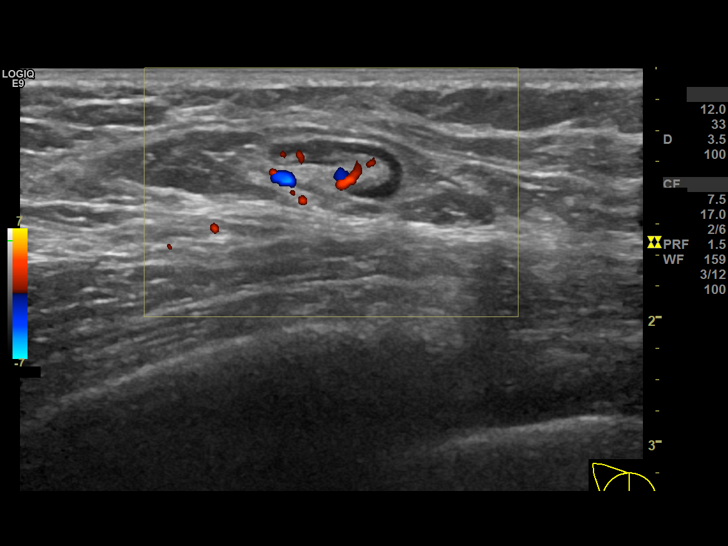
[im 7/10]
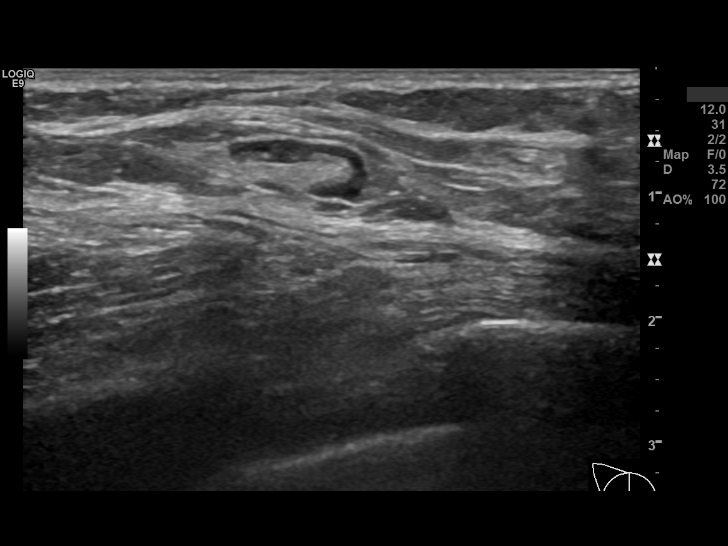
[im 8/10]
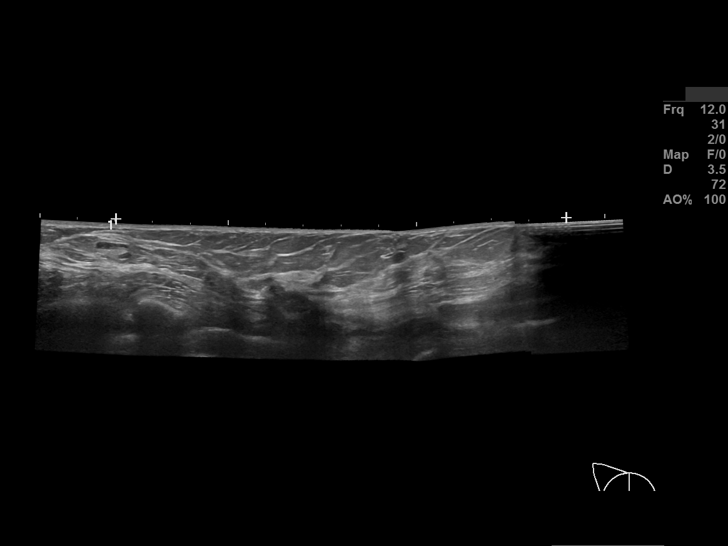
[im 9/10]
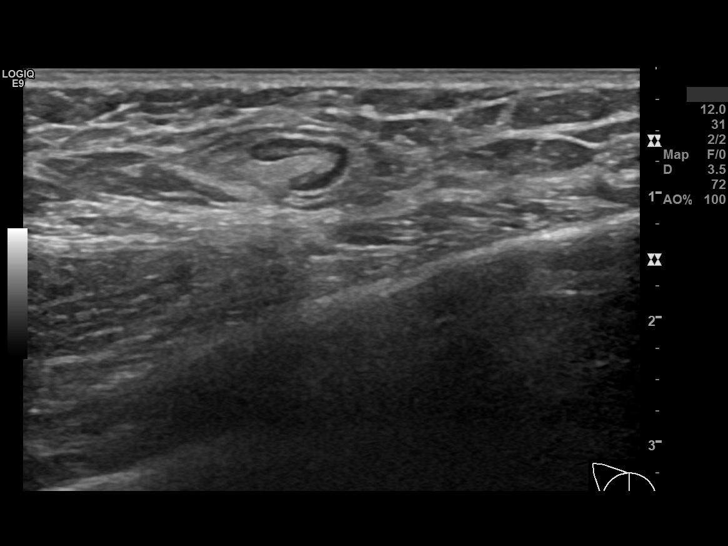
[im 10/10]
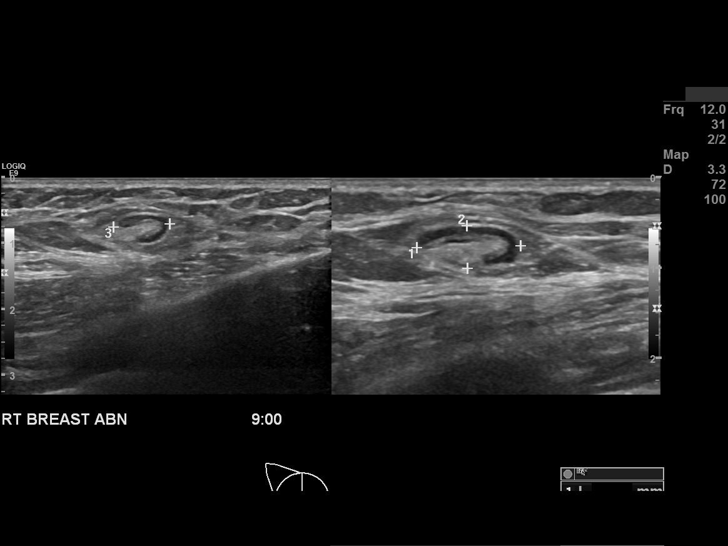

[10 of 10 positions shown; findings below may reference images not displayed]

IMPRESSION: BENIGN
There is no sonographic evidence of malignancy.
The 1.1 cm oval lymph node is benign.
A 1 year screening mammogram is recommended.
SUMMARY: The patient received a written copy of the results at the end of the examination.
Gergo Smilie George Preszeller M.D.
Demian/Mauthoof:10/14/2016 [DATE]
FINAL ASSESSMENT: BI-RADS:Category 2: Benign

## 2017-02-01 IMAGING — OT Imaging study
1 series · 1 of 1 positions shown · non-contrast
Comparison: none

REASON FOR EXAM: Postmenopausal screening.

RISK FACTORS:  Asthma/emphysema increases secondary risk for osteoporosis.
PRIOR EXAMS:  DXA from [HOSPITAL] dated 07/21/2015.
METHOD:  Scans of the forearm were performed using dual energy X-ray densitometry (DXA) with the Hologic Discovery SL (S/GLFCO1)  system.

[Series 3: — · left · 1 of 1 slices shown]
[im 1/1]
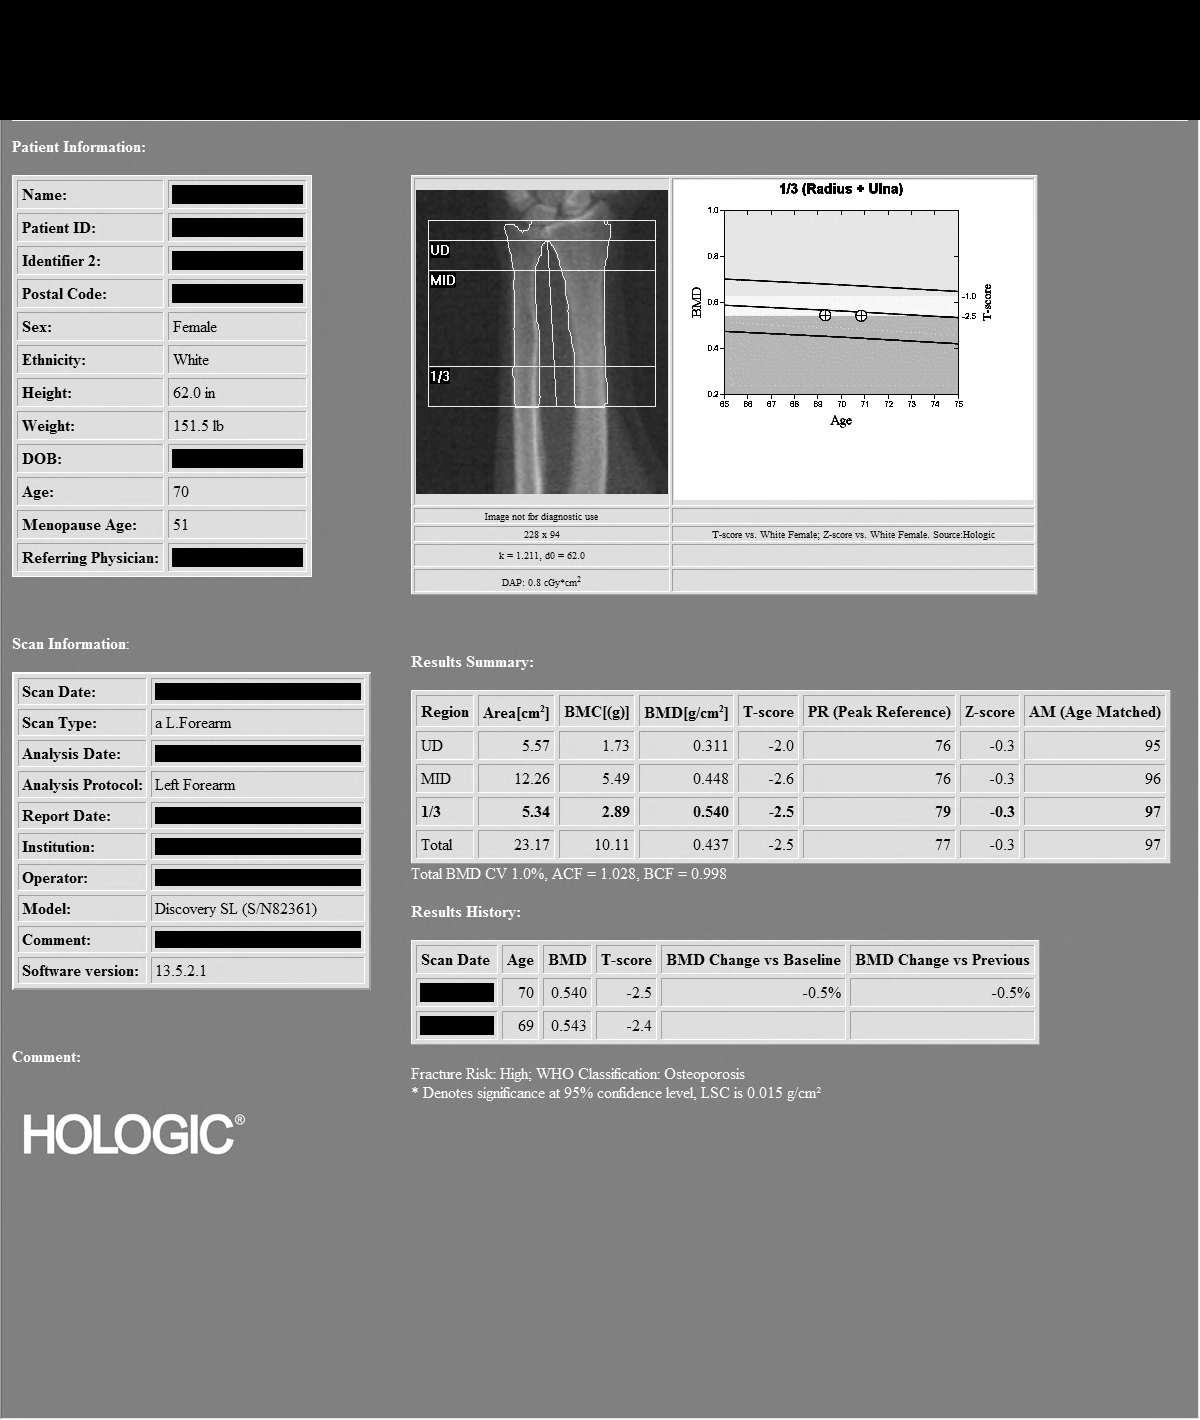

[1 of 1 positions shown; findings below may reference images not displayed]

IMPRESSION: As defined by World Health Organization, the patient meets the criteria for OSTEOPOROSIS based on forearm T-score.

PATIENT DEMOGRAPHICS:  70-year-old white female.
FINDINGS: 1.    Review of scanogram images shows back surgery and bilateral hip replacements preclude additional areas of analysis.  

2.    The lumbar spine exam is not performed due to retained metallic hardware.

3.    Bilateral hip arthroplasties preclude hip analysis.

4.   The left forearm exam using [DATE] radius region of interest shows average Bone Mineral Density is 0.540 gm/cm2 of Hydroxyapatite. The T-score (comparing patient with a young adult group) is 2.5 standard deviations BELOW mean. The Z-score (comparing patient with an age-matched group) is 0.3 standard deviations BELOW mean.

COMPARED TO PRIOR DXA, forearm bone density was 0.543 gm/cm2.  This is an interval decrease of 0.003 gm/cm2 or -0.5 %. Least significant change in the forearm is 0.015 gm/cm2.  This is not a statistically significant interval decrease.

RECOMMENDATIONS:  The patient states that she is taking supplements on a regular basis.  The patient should continue being a nonsmoker and  REGULAR EXERCISE TO PATIENT TOLERANCE WOULD BE OF BENEFIT. THE PATIENT IS CURRENTLY NOT TAKING PRESCRIBED MEDICATION FOR PREVENTION OF BONE LOSS. According to new criteria established by the National Osteoporosis Foundation, THE PATIENT DOES MEET THE CURRENT INDICATIONS FOR PRESCRIBED MEDICAL THERAPY.  The National Osteoporosis Foundation now recommends followup DXA scanning every two years in patients at risk regardless of whether the patient is undergoing pharmacological treatment.

World Health Organization criteria for diagnosis, please see link below.

[URL]

## 2017-03-23 IMAGING — US US THYROID
1 series · 14 of 25 positions shown · non-contrast
Comparison: Previous thyroid ultrasound 10/05/2016.

Images Obtained from [facility]
HISTORY: 70-year-old female, thyroid nodule.
TECHNIQUE: Thyroid ultrasound.

[Series 1: us thyroid · 14 of 26 slices shown]
[im 1/26]
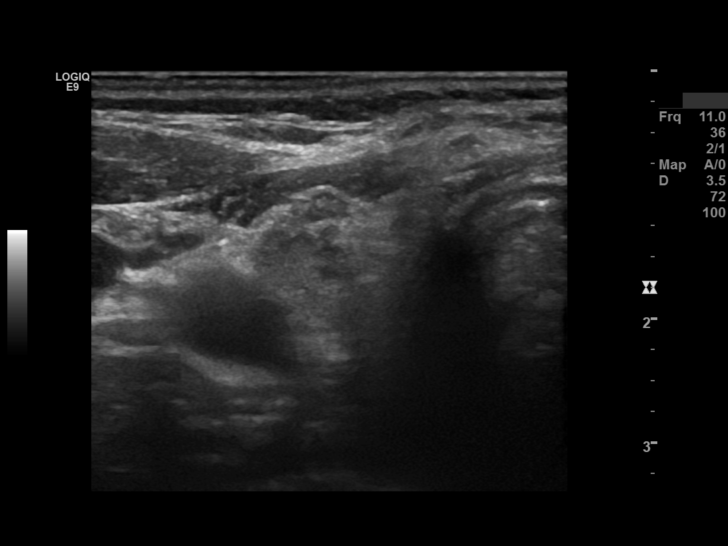
[im 3/26]
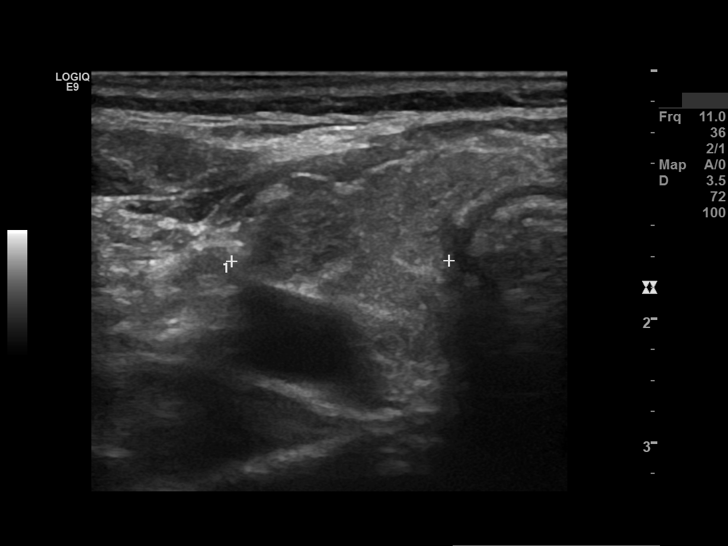
[im 5/26]
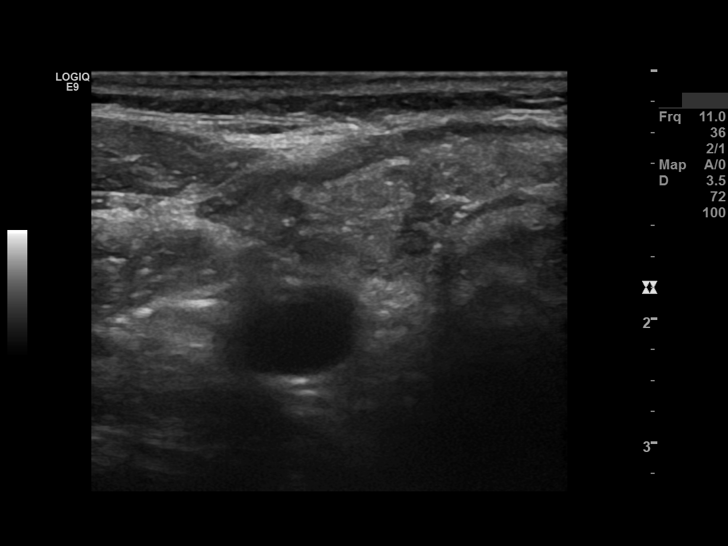
[im 7/26]
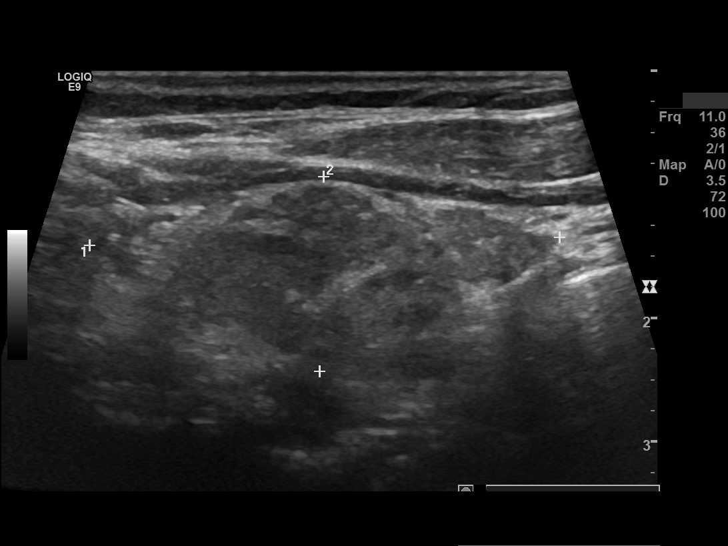
[im 9/26]
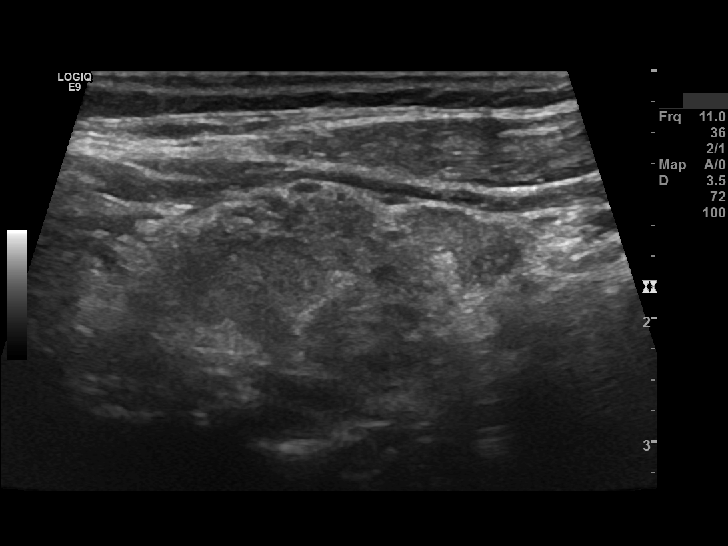
[im 10/26]
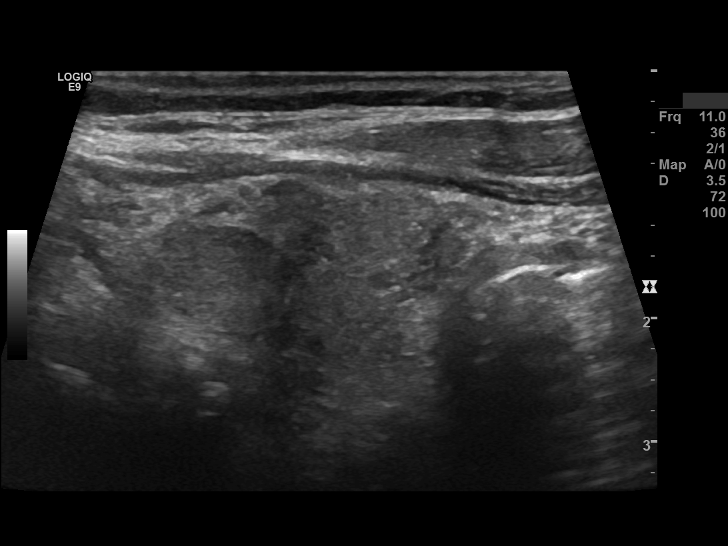
[im 12/26]
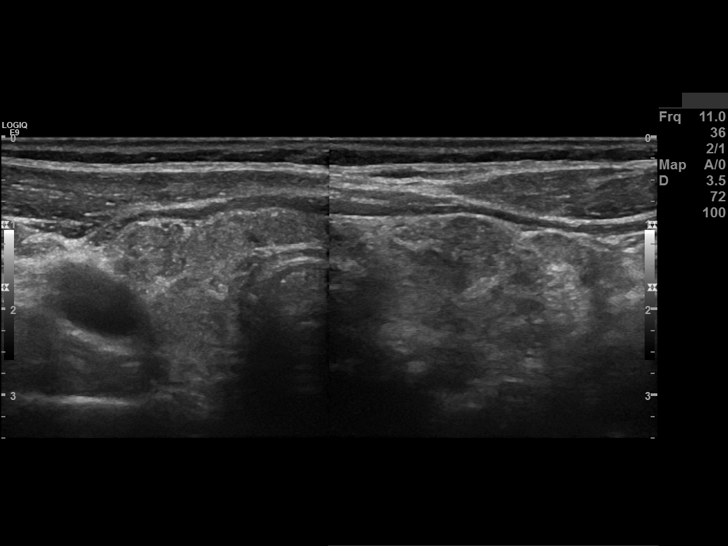
[im 14/26]
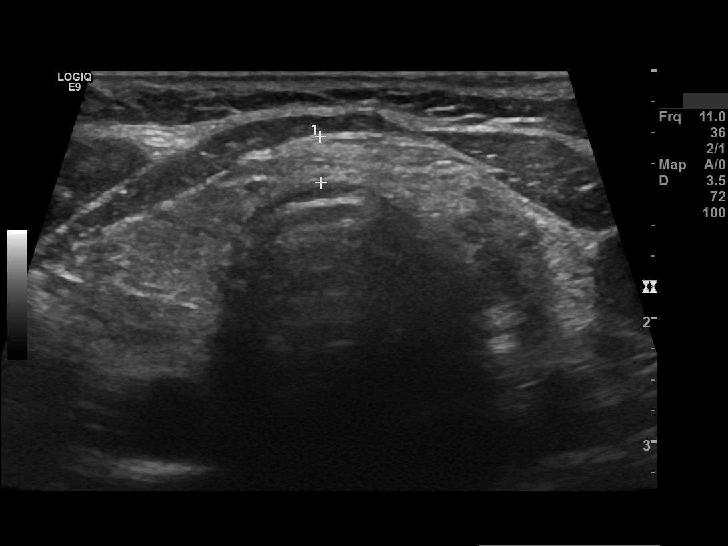
[im 16/26]
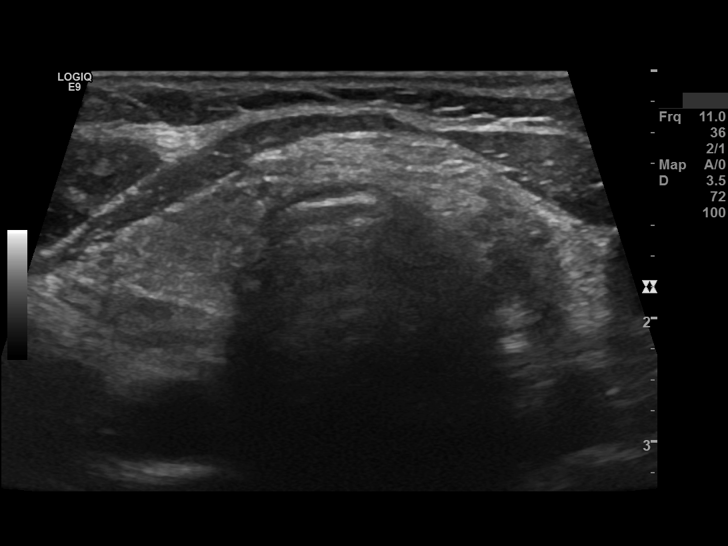
[im 17/26]
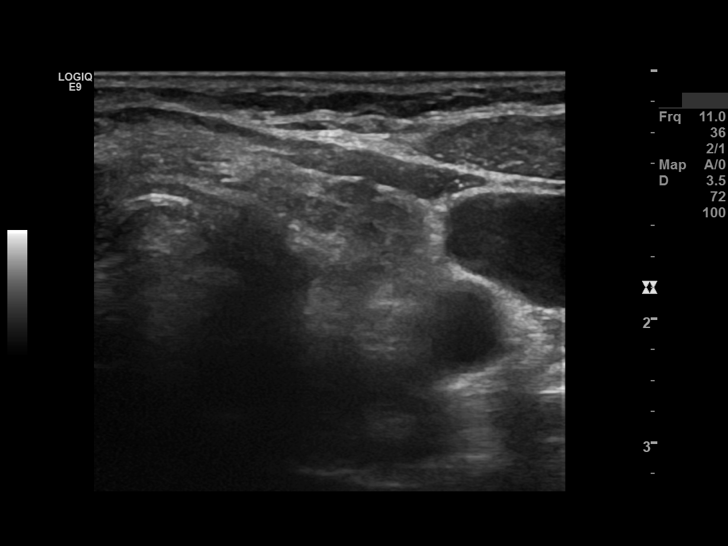
[im 19/26]
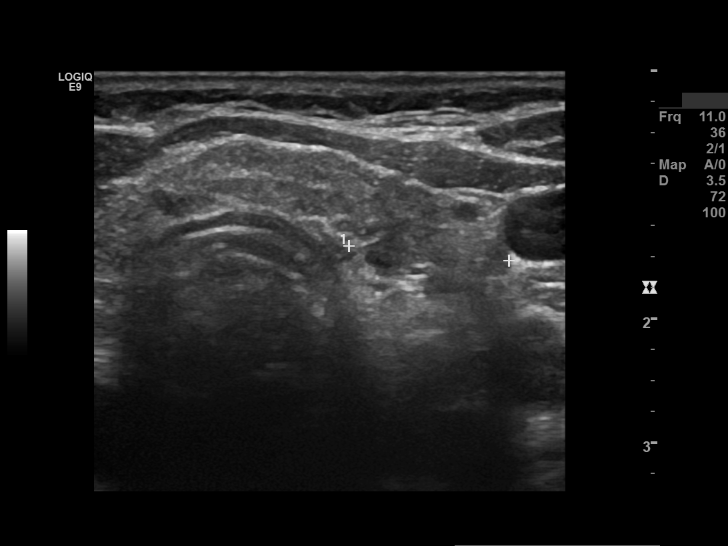
[im 21/26]
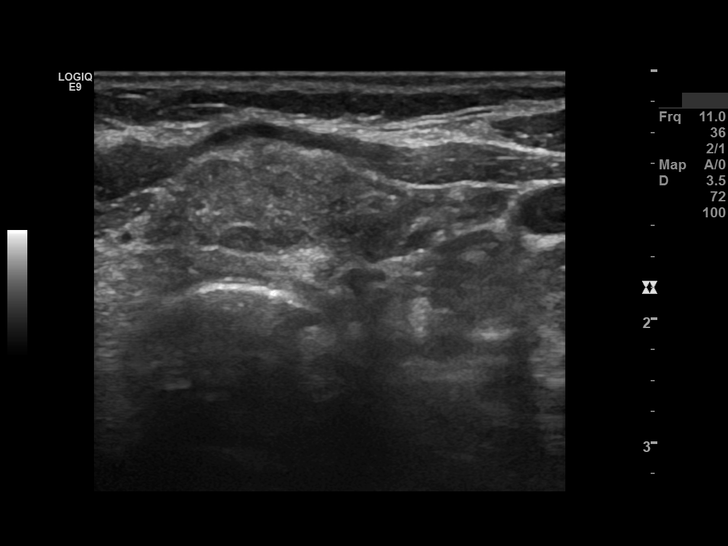
[im 23/26]
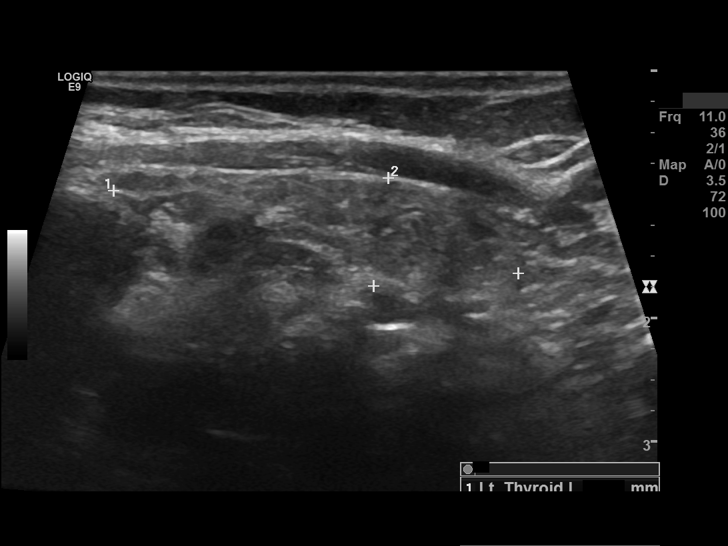
[im 26/26]
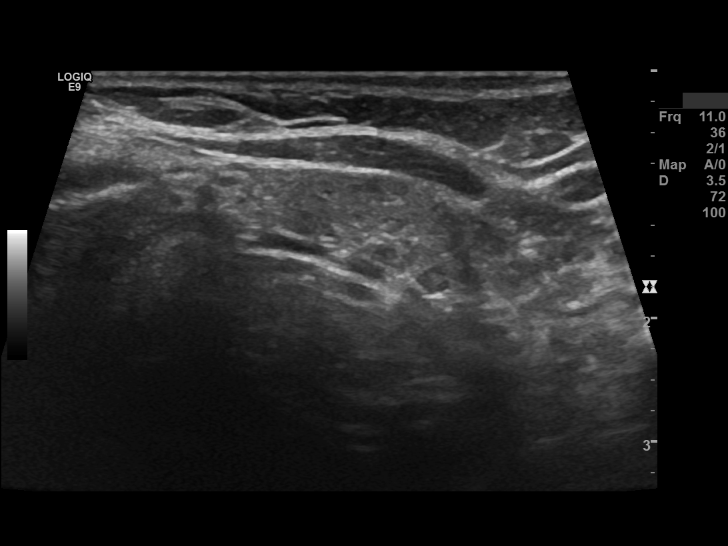

[14 of 25 positions shown; findings below may reference images not displayed]

FINDINGS: Thyroid size:
Right lobe: 38 x 16 x 18 mm. Previous 40 x 17 x 16 mm.
Isthmus: 4 mm. Previous 5 mm.
Left lobe: 33 x 9 x 13 mm. Previous 35 x 11 x 13 mm.
Thyroid nodules:
Right lobe nodules:
1. Central mid thyroid lateral solid 9 x 6 x 7 mm. Previous 13 x 8 x 8 mm. This nodule is isoechoic with somewhat lobular margins. By Doppler, minimal vascularity.
Left lobe nodules: None. Previous none.
Overall thyroid echotexture is heterogenous.
IMPRESSION: 1. Solitary nodule right lobe of about 9 x 6 x 7 mm, in view of slight differences in technique, about the same as previous.
2. Thyroid size normal.
3. Recommend 9-12 month follow-up.

## 2017-07-24 ENCOUNTER — Ambulatory Visit: Payer: PRIVATE HEALTH INSURANCE

## 2017-07-26 ENCOUNTER — Telehealth: Payer: MEDICARE

## 2017-07-27 ENCOUNTER — Ambulatory Visit: Payer: MEDICARE

## 2017-07-27 ENCOUNTER — Ambulatory Visit: Payer: PRIVATE HEALTH INSURANCE

## 2017-07-27 DIAGNOSIS — Z01818 Encounter for other preprocedural examination: Secondary | ICD-10-CM

## 2017-07-27 DIAGNOSIS — C73 Malignant neoplasm of thyroid gland: Secondary | ICD-10-CM

## 2017-07-28 ENCOUNTER — Ambulatory Visit: Payer: PRIVATE HEALTH INSURANCE

## 2017-07-28 ENCOUNTER — Ambulatory Visit: Payer: MEDICARE

## 2017-07-28 DIAGNOSIS — E041 Nontoxic single thyroid nodule: Secondary | ICD-10-CM

## 2017-07-31 ENCOUNTER — Ambulatory Visit: Payer: MEDICARE

## 2017-07-31 NOTE — H&P
UPDATED H&P REQUIREMENT    For [facility] and [facility]    WHAT IS THE STATUS OF THE PATIENT'S MOST CURRENT HISTORY AND PHYSICAL?   - The most current H&P was performed within the past 24 hours. No additional updated H&P documentation is necessary.     REFER TO MEDICAL STAFF POLICIES REGARDING PRE-PROCEDURE HISTORY AND PHYSICAL EXAMINATION AND UPDATED H&P REQUIREMENTS BELOW:    [facility] and [facility] Medical Staff Policy 200 - For Patients Undergoing Procedures Requiring Moderate or Deep Sedation, General Anesthesia or Regional Anesthesia    Contents of a History and Physical Examination (H&P):    The H&P shall consist of chief complaint, history of present illness, allergies and medications, relevant social and family history, past medical history, review of systems and physical examination, and assessment and plan appropriate to the patient's age.    For Patients Undergoing Procedures Requiring Moderate or Deep Sedation, General Anesthesia or Regional Anesthesia:    1. An H&P shall be performed within 24 hours prior to the procedure by a qualified member of the medical staff or designee with appropriate privileges, except as noted in item 2 below.    2. If a complete history and physical was performed within thirty (30) calendar days prior to the patient's admission to the Medical Center for elective surgery, a member of the medical staff assumes the responsibility for the accuracy of the clinical information and will need to document in the medical record within twenty-four (24) hours of admission and prior to surgery or major invasive procedure, that they either attest that the history and physical has been reviewed and accepted, or document an update of the original history and physical relevant to the patient's current clinical status.    3. Providing an H&P for  patients undergoing surgery under local anesthesia is at the discretion of the Attending Physician.     4. When a procedure is performed by a dentist, podiatrist or other practitioner who is not privileged to perform an H&P, the anesthesiologist's assessment immediately prior to the procedure will constitute the 24 hour re-assessment.The dentist, podiatrist or other practitioner who is not privileged to perform an H&P will document the history and physical relevant to the procedure.    5. If the H&P and the written informed consent for the surgery or procedure are not recorded in the patient's medical record prior to surgery, the operation shall not be performed unless the attending physician states in writing that such a delay could lead to an adverse event or irreversible damage to the patient.    6. The above requirements shall not preclude the rendering of emergency medical or surgical care to a patient in dire circumstances.

## 2017-08-01 ENCOUNTER — Inpatient Hospital Stay
Admit: 2017-08-01 | Discharge: 2017-08-02 | Disposition: A | Discharge status: Home / Self Care | Payer: PRIVATE HEALTH INSURANCE | Source: Home / Self Care

## 2017-08-01 DIAGNOSIS — C73 Malignant neoplasm of thyroid gland: Secondary | ICD-10-CM

## 2017-08-01 LAB — Glucose,POC
GLUCOSE,POC: 118 mg/dL — ABNORMAL HIGH (ref 65–99)
GLUCOSE,POC: 165 mg/dL — ABNORMAL HIGH (ref 65–99)

## 2017-08-01 LAB — PTH, IntraOp: PTH, INTACT INTRAOPERATIVE: 11 pg/mL

## 2017-08-01 MED ADMIN — ONDANSETRON HCL 4 MG/2ML IJ SOLN: INTRAVENOUS | @ 16:00:00 | Stop: 2017-08-01 | NDC 00641607825

## 2017-08-01 MED ADMIN — VALSARTAN 160 MG PO TABS: 160 mg | ORAL | @ 19:00:00 | Stop: 2017-08-02 | NDC 60687013901

## 2017-08-01 MED ADMIN — LACTATED RINGERS IV SOLN: 50 mL/h | INTRAVENOUS | @ 18:00:00 | Stop: 2017-08-02 | NDC 00338011704

## 2017-08-01 MED ADMIN — HYDROMORPHONE HCL 1 MG/ML IJ SOLN: INTRAVENOUS | @ 15:00:00 | Stop: 2017-08-01 | NDC 00409255201

## 2017-08-01 MED ADMIN — THROMBIN 5000 UNITS EX SOLR: @ 16:00:00 | Stop: 2017-08-01 | NDC 60793021505

## 2017-08-01 MED ADMIN — HYDROCODONE-ACETAMINOPHEN 5-325 MG PO TABS: 2 | ORAL | @ 21:00:00 | Stop: 2017-08-02 | NDC 00406012362

## 2017-08-01 MED ADMIN — PROPOFOL 200 MG/20ML IV EMUL: @ 15:00:00 | Stop: 2017-08-01 | NDC 63323026929

## 2017-08-01 MED ADMIN — SCOPOLAMINE 1.5 MG/3DAYS TD PT72: @ 14:00:00 | Stop: 2017-08-01 | NDC 66758020854

## 2017-08-01 MED ADMIN — HYDROMORPHONE HCL 1 MG/ML IJ SOLN: .4 mg | INTRAVENOUS | @ 18:00:00 | Stop: 2017-08-01 | NDC 76045000910

## 2017-08-01 MED ADMIN — GELATIN ABSORBABLE 100 EX MISC: @ 16:00:00 | Stop: 2017-08-01

## 2017-08-01 MED ADMIN — BUPIVACAINE HCL (PF) 0.25 % IJ SOLN: @ 16:00:00 | Stop: 2017-08-01 | NDC 55150016830

## 2017-08-01 MED ADMIN — CEFAZOLIN SODIUM 1 G IJ SOLR: @ 15:00:00 | Stop: 2017-08-01 | NDC 00143992490

## 2017-08-01 MED ADMIN — CALCIUM CARBONATE ANTACID 500 MG PO CHEW: 1000 mg | ORAL | @ 19:00:00 | Stop: 2017-08-02 | NDC 24385048547

## 2017-08-01 MED ADMIN — ROCURONIUM BROMIDE 50 MG/5ML IV SOLN: @ 15:00:00 | Stop: 2017-08-01 | NDC 00409955805

## 2017-08-01 MED ADMIN — MENTHOL 5 MG MT LOZG: 5 mg | OROMUCOSAL | @ 23:00:00 | Stop: 2017-08-02 | NDC 41528000066

## 2017-08-01 MED ADMIN — FAMOTIDINE 20 MG/2ML IV SOLN: INTRAVENOUS | @ 15:00:00 | Stop: 2017-08-01 | NDC 00641602225

## 2017-08-01 MED ADMIN — BSS IO SOLN: @ 16:00:00 | Stop: 2017-08-01 | NDC 00065179504

## 2017-08-01 MED ADMIN — HYDROMORPHONE HCL 1 MG/ML IJ SOLN: .2 mg | INTRAVENOUS | @ 18:00:00 | Stop: 2017-08-01 | NDC 76045000910

## 2017-08-01 MED ADMIN — LABETALOL HCL 5 MG/ML IV SOLN: 5 mg | INTRAVENOUS | @ 17:00:00 | Stop: 2017-08-01 | NDC 47781058629

## 2017-08-01 MED ADMIN — EPHEDRINE SULFATE 50 MG/ML IV SOLN: @ 15:00:00 | Stop: 2017-08-01 | NDC 17478041510

## 2017-08-01 MED ADMIN — SODIUM CHLORIDE 0.9 % IV SOLN: @ 15:00:00 | Stop: 2017-08-01 | NDC 00338004902

## 2017-08-01 MED ADMIN — SUGAMMADEX SODIUM 200 MG/2ML IV SOLN: @ 17:00:00 | Stop: 2017-08-01 | NDC 00006542312

## 2017-08-01 MED ADMIN — LABETALOL HCL 5 MG/ML IV SOLN: 5 mg | INTRAVENOUS | @ 18:00:00 | Stop: 2017-08-01 | NDC 47781058629

## 2017-08-01 MED ADMIN — DEXAMETHASONE SODIUM PHOSPHATE 4 MG/ML IJ SOLN: INTRAVENOUS | @ 15:00:00 | Stop: 2017-08-01 | NDC 67457042312

## 2017-08-01 MED ADMIN — LIDOCAINE HCL (CARDIAC) 20 MG/ML IV SOLN: @ 15:00:00 | Stop: 2017-08-01 | NDC 00409132305

## 2017-08-01 MED ADMIN — BUPIVACAINE-EPINEPHRINE (PF) 0.25% -1:200000 IJ SOLN: @ 16:00:00 | Stop: 2017-08-01 | NDC 00409904201

## 2017-08-01 MED ADMIN — ACETAMINOPHEN 500 MG PO TABS: 1000 mg | ORAL | @ 14:00:00 | Stop: 2017-08-01 | NDC 00904673061

## 2017-08-01 NOTE — Op Note
Report rec'd from [facility]. Pt. With vss and resting comfortably, denies pain and perioral numbness or tingling. Pt. Is neuro intact oriented x4 and following all commands. Midline neck steri-strips cdi with no s/s of bleeding or swelling.

## 2017-08-01 NOTE — Op Note
----------------------------------------------------------------------------------------------------------------------  (THE FOLLOWING IS FOR NURSING REFERENCE AND HAS BEEN PULLED FROM THE CURRENT CHART)    Procedure(s) (LRB):  THYROIDECTOMY TOTAL, POSSIBLE CENTRAL NECK DISSECTION (N/A)  Malignant neoplasm of thyroid gland (HCC/RAF) [C73]  @RRTREATTEAM @  __________________________________________________________________________________    History  Past Medical History:   Diagnosis Date   ??? Diabetes (HCC/RAF)    ??? Difficult airway for intubation 2019    Able to intubate with videolaryngoscope; mask ventilation possible   ??? Hypertension    ??? Hypothyroidism     post-ablative   ??? Papillary thyroid carcinoma (HCC/RAF) 2019   ??? Post-operative nausea and vomiting    ??? 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 Thyroid cancer (HCC/RAF) 08/01/2017     Past Surgical History:   Procedure Laterality Date   ??? COSMETIC SURGERY     ??? EYE SURGERY     ??? HYSTERECTOMY     ??? TOTAL THYROIDECTOMY  08/01/2017    With parathyroid transplantation   ??? 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      5 None None None        __________________________________________________________________________________    Intake and Output  No intake/output data recorded.  I/O this shift:  In: 750 [I.V.:750]  Out: 20 [Blood:20]     ___________________________________________________________________________ _______    IV Drips/Fluids/PCA:   ??? balanced salts     ??? lactated ringers 50 mL/hr (08/01/17 1033)     __________________________________________________________________________________    Lines, Drains, and Airways     Peripheral IV 20 G Left Hand (Active)     __________________________________________________________________________________    ----------------------------------------------------------------------------------------------------------------------      PACU NursingTransfer Note  11:29 AM, 08/01/2017      Isolation? moderately difficult . needed oral airway, mac 3     Difficult Airway?   Yes: Blade # mac 3, ETT # 7.0    Antibiotics in OR:  Last Antibiotic (last 24 hours) Showing orders from other encounters    Date/Time Action Medication Dose    08/01/17 0800 Given    ceFAZolin inj 2 g        Acetaminophen given @:  Med Administrations and Associated Flowsheet Values (last 24 hours)     Date/Time Action Medication Dose    08/01/17 0659 Given    acetaminophen tab 1,000 mg 1,000 mg        Last pain medication:   Hydromorphone .2 mg at ~ 1115    Does the patient use prescription pain medication at home (prior to admission)?   No    Pain level: Acceptable for patient?  Yes    Last Antiemetic:   None in PACU    Surgical Site:   Intact and Dry or with Minimal Drainage  Lines, Drains, and Airways     Wound  Incision 08/01/17 Other (Comment) Neck less than 1 day              Respiratory is at baseline?   No: 2 liters nasal cannula now    Has the patient received Flumazenil or Narcan in PACU?   No  (if ''Yes'', must be at least 45 minutes prior to transfer)     Aldrete: 10    Level of Consciousness:   Awake, Alert, Oriented x four    Cardiac Rhythm?   Normal Sinus    Diet: NPO    Tolerating liquids:   Undetermined    Activity:   MAE: Has not ambulated    Voided:    Due to Void    Significant Other Location:   [facility]    Will Transport to: Floor With: HA, Escort or Care Partner    Continuity of Care Issues from OR or PACU:   None

## 2017-08-01 NOTE — Consults
OUTPATIENT ENDOCRINE SURGERY CONSULTATION NOTE    PATIENT: Patricia Ball  MRN: [id number]  DOB: 13-Apr-1945  DATE OF SERVICE: 07/31/2017   ATTENDING: Dr. Ashok Croon    REFERRING PHYSICIAN: Dr. Adela [facility])    Subjective:     Reason for Referral: PTC    History of Present Illness:  Patricia Ball is a 72 y.o. female who presents with newly diagnosed papillary thyroid carcinoma. 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 FNA biopsy of a pericentimeter right isthmus nodule was positive for papillary thyroid carcinoma. Notably, she has a history of hyperthyroidism for which she underwent radioactive iodine ablation about 5 years ago. She currently takes 88 mcg levothyroxine and 5 mcg liothyronine. She has no family history of cancer. She has a history of EBRT to the buttock for treatment of her sarcoma.    Past History:  Past Medical History:   Diagnosis Date   ??? Diabetes (HCC/RAF)    ??? Hypertension    ??? Hypothyroidism     post-ablative   ??? Post-operative nausea and vomiting    ??? 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 COSMETIC SURGERY     ??? EYE SURGERY     ??? HYSTERECTOMY     ??? 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 She is an avid Teacher, English as a foreign language. She does not smoke. Drinks about 2 servings of alcohol per month.    Allergies:   No Known Allergies     Medications:   Prior to Admission medications    Medication Sig Start Date End Date Taking? Authorizing Provider   levothyroxine 88 mcg tablet Take 88 mcg by mouth daily.    [provider] liothyronine 5 mcg tablet Take 5 mcg by mouth daily.    [provider]   metFORMIN 500 mg tablet Take 500 mg by mouth daily.    [provider]   valsartan 160 mg tablet Take 160 mg by mouth daily.    [provider]       Review of Systems:   A 14 point review of systems was performed.  Constitutional: No fever, chills, weight gain, weight loss, heat intolerance, excessive thirst, fatigue, or loss of appetite  Eyes: Denies vision change  ENT: Denies ear infections, difficulty swallowing, or changes in voice  Resp: Denies cough, SOB, wheezing  CV: Denies chest pain/pressure, palpitations  MSK: Denies bone/joint pain, back pain, muscle pain, muscle weakness  GI: Denies abdominal pain, nausea, vomiting, heartburn, constipation, diarrhea, bloody stool  GU: Denies frequent urination, painful urination, blood in urine  Neurologic/Psych: Denies memory loss or forgetfulness, depression, difficulty sleeping  Integumentary: Denies dry skin, itching, abnormal hair loss/growth  Hematologic/lymphatic: Denies swollen glands or leg swelling  All/Imm: No seasonal allergies    Objective:     Physical Exam:    Vital Signs: BP (!) 181/92  ~ Pulse 112  ~ Temp 36.7 ???C (98.1 ???F) (Oral)  ~ Resp 18  ~  Ht 5' 1.5'' (1.562 m)  ~ Wt 153 lb 10.6 oz (69.7 kg)  ~ SpO2 99%  ~ BMI 28.56 kg/m???       General: No acute distress, alert, appropriate  Eyes: Anicteric, clear conjunctivae  Head and Neck: Normocephalic, atraumatic, no cervical scars but +scar on upper chest from [facility] excision  Cardiovascular: Regular rate and rhythm, no murmurs  Respiratory: Breathing unlabored on room air, clear to auscultation bilaterally  Abdomen: Soft, non-tender, non-distended  Extremities: Warm, well perfused, no edema  Psych: Normal affect, normal linear thought processes    Labs:  TSH 4.23    Studies:  FNA isthmus nodule (OSH): +PTC    Ultrasound performed in clinic today shows a 1.1 cm irregular hypoechoic mass with irregular borders just to the right of midline, forming an obtuse angle with the trachea. There is a round subcentimeter solid nodule on the right side. Otherwise, the thyroid parenchyma is nearly absent, consistent with prior ablation. No suspicious lymphadenopathy.    Assessment/Plan:     Patricia Ball is a 72 y.o. female with a history of hyperthyroidism status post RAI ablation who presents with PTC. The features of the tumor are high-risk, specifically its relationship to the trachea, so we do recommend total thyroidectomy. Of note, she has almost no perceptible thyroid outside of the 2 small nodules.     We discussed the potential 1% risk of infection, 1% risk of bleeding, 1/200 risk of permanent nerve damage/hoarseness, 7% risk of temporary nerve damage/hoarseness, the possibility of temporary hypocalcemia, and the 1/200 risk of permanent hypocalcemia (total thyroidectomy, 1% with central neck dissection). We discussed a 2 week telemedicine post-op visit after surgery.    Patient was seen and examined by and plan developed with the attending physician, Dr. Ashok Croon.    Author:  Adolphus Birchwood, MD  Endocrine Surgery Fellow  07/31/2017    I have seen and examined the patient.  I agree with the findings as described above.  The management plan was developed jointly with the fellow.

## 2017-08-01 NOTE — Op Note
7096 Admitted from [facility] aroused and able to MAE x4. On continuous monitoring.Oral airway removed.

## 2017-08-01 NOTE — Op Note
Patient: Patricia Ball    Date of Operation(s)/Procedure(s): 08/01/2017    Pre-op Diagnosis: Malignant neoplasm of thyroid gland (HCC/RAF) [C73]       Post-op Diagnosis: Malignant neoplasm of thyroid gland (HCC/RAF) [C73]    Operation(s)/Procedure(s):  TOTAL THYROIDECTOMY   PARATHYROID AUTOTRANSPLANTATION    Surgeon(s) and Role:     * Mickie Kay., MD - Primary     * Murlean Hark., MD - Surgeon 1st Assist - Fellow    Anesthesia Staff and Role:  Anesthesia Resident: Angela Adam., MD  Anesthesiologist: Edward Jolly., MD    Anesthesia Type:   General    Assistance:  I required the assistance of Dr. Virginia Rochester, endocrine surgery fellow, because no qualified resident was available.     Clinical Indication:  Patricia Ball is a 72 y.o. year old female with a history of radioactive iodine ablation for hyperthyroidism who now presents with papillary thyroid carcinoma in the thyroid isthmus, found incidentally on a surveillance PET scan.     Findings:  Atrophic ablated thyroid gland with multifocal tumors (isthmus and right thyroid)  Isthmus tumor adherent to the trachea - expected microscopically positive margin at the cauterized edge  Right inferior parathyroid autotransplanted into the left sternocleidomastoid muscle    Measures:  Per established protocol, intravenous cefazolin was administered as a single dose within 5 minutes prior to incision. Sequential compression devices were applied to the legs prior to the induction of anesthesia.    Technique:  After informed consent was obtained and an appropriate surgical time-out was performed, the patient was given a general anesthetic. The patient was carefully positioned, prepped, and draped. Intraoperative ultrasound was performed using a high-frequency linear array probe. Bilateral superficial cervical plexus block was performed by the surgeon using 10 cc of 0.25% Marcaine on each side. We made a 5 cm low cervical incision and created subplatysmal flaps. We entered the visceral compartment by partially dividing the strap muscles. Pre-tracheal tissue was removed from the pre-cricoid area. We then transected the isthmus. There was a firm tumor adherent to the trachea in this area. In transecting the isthmus, we likely divided some of the tumor.    On the right side, we found an additional focus of tumor in the right lobe, which was otherwise atrophic from the prior ablation. We rotated the right lobe medially and ligated the middle thyroid vein.  We retracted the superior pole laterally. This allowed [place] to develop the medial avascular space. We then came across the superior pole vasculature using clamps, ties, and the energy device while protecting the external branch of the superior laryngeal nerve. The right superior parathyroid gland was not definitively seen. The right inferior parathyroid gland was found high on the ablated thyroid such that the blood supply could not be adequately preserved. The parathyroid was cut away and placed in BSS for later autotransplantation.  We took down several inferior pole veins. The specimen was delivered up into the wound. The right recurrent laryngeal nerve was found in its normal position -- it was large and appeared to have two large branches. It was carefully traced out with the right angle and protected. We came across the tertiary branches of the inferior thyroid artery and ligament of Berry using clamps and ties, thus liberating the right lobe.    On the left side, we found atrophic, ablated thyroid.  We rotated the left lobe medially and ligated the middle thyroid vein.  We retracted the superior pole laterally.  This allowed [place] to develop the medial avascular space. We then came across the superior pole vasculature using clamps, ties, and the energy device while protecting the external branch of the superior laryngeal nerve. The left superior parathyroid gland was not definitively seen. The left inferior parathyroid gland was not definitively seen.  We took down several inferior pole veins. The specimen was delivered up into the wound. The left recurrent laryngeal nerve was found in its normal position -- as with the right side, the nerve was fairly large and had two branches. It was carefully traced out with the right angle and protected. We came across the tertiary branches of the inferior thyroid artery and ligament of Berry using clamps and ties, thus liberating the left lobe.    We carefully inspected the field for bleeding and achieved hemostasis by applying thrombin-soaked Gelfoam. When we were content that there was no further bleeding, we brought the muscular layer together. The right inferior parathyroid that had been placed in BSS was morcellated and injected into the left sternocleidomastoid muscle. The injection site was marked with a medium hemoclip.    The skin and soft tissues were closed in 2 additional layers. Sponge, needle, and instrument counts were correct.        Complications: None; patient tolerated the operation(s)/procedure(s) well.     Estimated Blood Loss: less than 50 mL     Disposition: PACU - hemodynamically stable.     Attending Attestation: I was present and scrubbed for the key portions of the procedure.    Specimens:   ID Type Source Tests Collected by Time   1 : Pretracheal Tissue Tissue Trachea TISSUE EXAM/FOREIGN BODY (AP) Mickie Kay., MD 08/01/2017 0827   2 : Inferior Midline Tissue Tissue Neck TISSUE EXAM/FOREIGN BODY (AP) Mickie Kay., MD 08/01/2017 [phone]   3 : Right Thyroid Lobe Tissue Thyroid, Right TISSUE EXAM/FOREIGN BODY (AP) Mickie Kay., MD 08/01/2017 [phone]   4 : Left Thyroid Lobe Tissue Thyroid, Left TISSUE EXAM/FOREIGN BODY (AP) Mickie Kay., MD 08/01/2017 [phone]   5 : Right Thyroid Lobe , Posterior Margin Tissue Thyroid, Right TISSUE EXAM/FOREIGN BODY (AP) Mickie Kay., MD 08/01/2017 [phone]

## 2017-08-02 LAB — Glucose,POC
GLUCOSE,POC: 209 mg/dL — ABNORMAL HIGH (ref 65–99)
GLUCOSE,POC: 200 mg/dL — ABNORMAL HIGH (ref 65–99)
GLUCOSE,POC: 119 mg/dL — ABNORMAL HIGH (ref 65–99)

## 2017-08-02 LAB — Calcium: CALCIUM: 9.9 mg/dL (ref 8.6–10.4)

## 2017-08-02 MED ORDER — DOCUSATE SODIUM 100 MG PO CAPS
100 mg | ORAL_CAPSULE | Freq: Two times a day (BID) | ORAL | 0 refills | Status: AC | PRN
Start: 2017-08-02 — End: ?

## 2017-08-02 MED ORDER — CALCIUM CARBONATE ANTACID 500 MG PO CHEW
2000 mg | ORAL_TABLET | Freq: Two times a day (BID) | ORAL | 0 refills | Status: AC
Start: 2017-08-02 — End: ?

## 2017-08-02 MED ORDER — CALCITRIOL 0.25 MCG PO CAPS
.25 ug | ORAL_CAPSULE | Freq: Two times a day (BID) | ORAL | 0 refills | Status: AC
Start: 2017-08-02 — End: ?

## 2017-08-02 MED ORDER — HYDROCODONE-ACETAMINOPHEN 5-325 MG PO TABS
ORAL_TABLET | 0 refills | Status: AC
Start: 2017-08-02 — End: ?

## 2017-08-02 MED ADMIN — LIOTHYRONINE SODIUM 5 MCG PO TABS: 5 ug | ORAL | @ 15:00:00 | Stop: 2017-08-02 | NDC 42794001812

## 2017-08-02 MED ADMIN — MENTHOL 5 MG MT LOZG: 5 mg | OROMUCOSAL | @ 06:00:00 | Stop: 2017-08-02 | NDC 41528000066

## 2017-08-02 MED ADMIN — LEVOTHYROXINE SODIUM 88 MCG PO TABS: 88 ug | ORAL | @ 15:00:00 | Stop: 2017-08-02 | NDC 42292003820

## 2017-08-02 MED ADMIN — HYDROCODONE-ACETAMINOPHEN 5-325 MG PO TABS: 2 | ORAL | @ 05:00:00 | Stop: 2017-08-02 | NDC 00406012362

## 2017-08-02 MED ADMIN — INSULIN ASPART 100 UNIT/ML SC SOPN: 3 mL | SUBCUTANEOUS | @ 02:00:00 | Stop: 2017-08-02 | NDC 00169633910

## 2017-08-02 MED ADMIN — VALSARTAN 160 MG PO TABS: 160 mg | ORAL | @ 15:00:00 | Stop: 2017-08-02 | NDC 60687013901

## 2017-08-02 MED ADMIN — INSULIN ASPART 100 UNIT/ML SC SOPN: 3 mL | SUBCUTANEOUS | @ 05:00:00 | Stop: 2017-08-02 | NDC 00169633910

## 2017-08-02 MED ADMIN — CALCITRIOL 0.25 MCG PO CAPS: .25 ug | ORAL | @ 01:00:00 | Stop: 2017-08-02 | NDC 00054000725

## 2017-08-02 MED ADMIN — CALCITRIOL 0.25 MCG PO CAPS: .25 ug | ORAL | @ 15:00:00 | Stop: 2017-08-02 | NDC 00054000725

## 2017-08-02 MED ADMIN — CALCIUM CARBONATE ANTACID 500 MG PO CHEW: 1000 mg | ORAL | @ 01:00:00 | Stop: 2017-08-02 | NDC 24385048547

## 2017-08-02 MED ADMIN — CALCIUM CARBONATE ANTACID 500 MG PO CHEW: 1000 mg | ORAL | @ 13:00:00 | Stop: 2017-08-02 | NDC 24385048547

## 2017-08-02 MED ADMIN — MENTHOL 5 MG MT LOZG: 5 mg | OROMUCOSAL | @ 15:00:00 | Stop: 2017-08-02 | NDC 41528000066

## 2017-08-02 MED ADMIN — CALCIUM CARBONATE ANTACID 500 MG PO CHEW: 1000 mg | ORAL | @ 06:00:00 | Stop: 2017-08-02 | NDC 24385048547

## 2017-08-02 NOTE — Consults
FINAL DISCHARGE MULTIDISCIPLINARY NOTE  Department of Care Coordination      Admit YIFO:[id number]  Anticipated Date of Discharge: 08/02/2017    Following MD:Yeh, Sharlene Motts., MD    Home Address:[address] [address]      DISCHARGE INFORMATION:     Discharge Address: [place] [address]    Individual(s) notified of discharge plan:  Contact Name: [name] Relationship: Self   Contact Number(s): [phone]      Support Systems: Family       Medicare Important Message Provided: Not Applicable       Aidin Choice List: Not Applicable             Final Discharge Needs: No Needs    Facility Transfer/Placement (if applicable):        Home Health Coordination (if applicable):        Home Infusion Coordination (if applicable):        Hospice (if applicable):        Home Durable Medical and/or Respiratory Equipment (if applicable):        Hemodialysis (if applicable):        Homeless Discharge (if applicable):        Transportation Arrangements (if applicable):        Other Arrangements (if applicable):        Felipa Emory,  08/02/2017

## 2017-08-02 NOTE — Other
Patients Clinical Goal:   Clinical Goal(s) for the Shift: VSS, Pain Management, Monitor Post Op symptons, Safety  Identify possible barriers to advancing the care plan: NONE  Stability of the patient: Moderately Stable - low risk of patient condition declining or worsening   End of Shift Summary: VSS, Pt requesting hydrocodone prn x 1 overnight + lozenge for throat. Pt calling me in room to show me redness on right arm that she said started on her upper arm and is now on hand at base of fingers. MD's shown rash in rounds. Drsg to anterior neck C/D/I. Blood glucose treated per sliding scale

## 2017-08-02 NOTE — Other
Patients Clinical Goal:   Clinical Goal(s) for the Shift: VSS, pain control, safety & comfort  Identify possible barriers to advancing the care plan: none  Stability of the patient: Moderately Stable - low risk of patient condition declining or worsening   End of Shift Summary: Pt remained stable VS. Pt declined pain medications. Anterior neck dressing CDI without hematoma or bleeding. Pt denied numbness or tingling around the mouth and fingers. Pt tolerated regular diet without issue. Pt voided without issue. Pt ambulated with steady gait without issue. Discontinued IV access. Discharge instructions given to pt and verbalized understanding well. Pt discharged with family escorted via wheelchair at [phone]. All personal belongings taken with pt.

## 2017-08-02 NOTE — Consults
CASE MANAGER ASSESSMENT      Admit WGNF:[id number]    Date of Initial CM Assessment: 08/02/2017    Problems: Active Problems:    Thyroid cancer (HCC/RAF) POA: Yes       Past Medical History:   Diagnosis Date   ??? Diabetes (HCC/RAF)    ??? Difficult airway for intubation 2019    Able to intubate with videolaryngoscope; mask ventilation possible   ??? Hypertension    ??? Hypothyroidism     post-ablative   ??? Papillary thyroid carcinoma (HCC/RAF) 2019   ??? Post-operative nausea and vomiting    ??? 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 Thyroid cancer (HCC/RAF) 08/01/2017    Past Surgical History:   Procedure Laterality Date   ??? COSMETIC SURGERY     ??? EYE SURGERY     ??? HYSTERECTOMY     ??? TOTAL THYROIDECTOMY  08/01/2017    With parathyroid transplantation   ??? 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 W ADLs    Current Living Situation: Lives Alone                 Support Systems: Family members    Contact Name: Cori Razor Phone Number: [phone]                 Living Accommodation: Home/Apartment                       Home Health Services: No             Other Therapies Prior to Admit: None                        Rental DME/O2 in Home: No                       Do you have your Primary Care Doctor's office number?: No                 Do you need information/education regarding your medical condition?: No         Verbalized financial concerns?: No       Were you hospitalized in the last 30 days?: No      READMIT ASSESSMENT: (IF APPLICABLE)                                                  FOLLOW UP APPT QUESTIONS: (IF APPLICABLE)                      RISK STRATIFICATION: (IF APPLICABLE)                     DISCHARGE ASSESSMENT:     Projected Date of Discharge: 08/02/2017    Anticipated Complex D/C?: No    Projected Discharge to: Home Projected Discharge Needs: Other (Comment) (TBD)      Aidin Choice List: Not Applicable  Support Identified at Discharge: Child  Name of Support Person: Same as above Phone Number: Same as above       Who is available to transport you upon discharge?: Family Transportation             Towanda Malkin,  08/02/2017

## 2017-08-03 LAB — Tissue Exam

## 2017-08-21 ENCOUNTER — Telehealth: Payer: PRIVATE HEALTH INSURANCE

## 2017-08-21 NOTE — Progress Notes
Interval history:  Patricia Ball and I had a telemedicine encounter today. No problems or complaints have arisen since our last visit. Pathology results are below.      A.  PRETRACHEAL TISSUE (EXCISION):   - Skeletal muscle, fibroadipose, and thyroid tissue with cautery effect  - No perithyroidal lymph nodes present  - Negative for maligancy    B.  NECK TISSUE, INFERIOR MIDLINE (EXCISION):   - Two of two lymph lymph nodes positive for metastatic papillary thyroid carcinoma (2/2)             - Largest tumor deposit: 0.2 cm    C.  THYROID GLAND, RIGHT LOBE (LOBECTOMY):   - Papillary thyroid carcinoma, classic type             - Size: 1.5 cm (per gross description)             - Focal extension into perithyroidal fibroadipose tissue             - No lymphovascular or perineural invasion present  - Tumor is focally present at the posterior resection margin  - Four perithyroidal lymph nodes negative for malignancy (0/4)  - Pathologic TNM Classification (AJCC 8th ed.): pT1b N1a  - Background atrophic thyroid tissue, consistent with history of radioactive ablation  - Small fragment of normocellular parathyroid tissue    D.  THYROID GLAND, LEFT LOBE (LOBECTOMY):  - Atrophic thyroid tissue, consistent with history of radioactive ablation  - Normocellular perithyroidal parathyroid tissue present  - Negative for malignancy    E.  THYROID, RIGHT, POSTERIOR MARGIN (EXCISION):   - Fibroadipose and atrophic thyroid tissue with cautery effect  - Negative for malignancy    Physical examination:  Reveals a normal voice and a nicely healed wound.    Impression:  I am satisfied with the patient's progress.  The pathology results are favorable.  I do not think she will require radioactive iodine.  I would like to see the patient in 3 months over telemedicine.

## 2017-09-11 MED ORDER — CALCITRIOL 0.25 MCG PO CAPS
ORAL_CAPSULE | 0 refills
Start: 2017-09-11 — End: ?

## 2017-10-04 IMAGING — US US SOFT TISSUE NECK/THYROID
1 series · 13 of 25 positions shown · non-contrast
Comparison: 03/23/17

HISTORY: Thyroid nodule
TECHNIQUE: Multiple images from a real time, Duplex and color Doppler ultrasound of the thyroid gland are performed.

[Series 1: us soft tissue neck/thyroid · 13 of 38 slices shown]
[im 1/38]
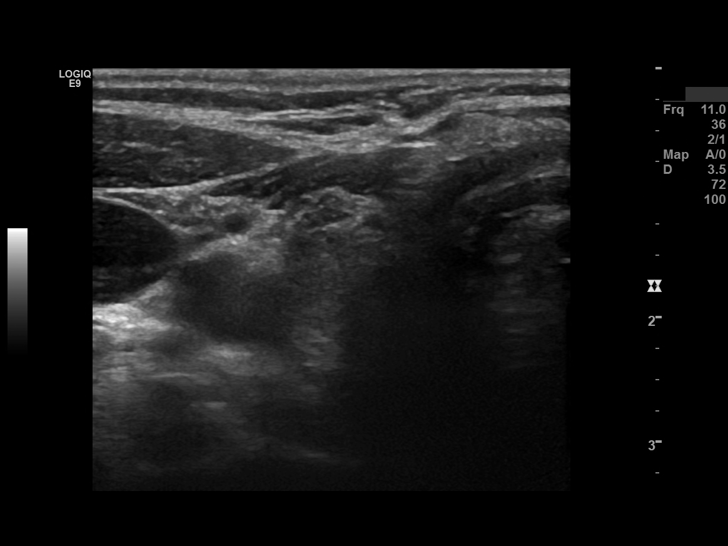
[im 4/38]
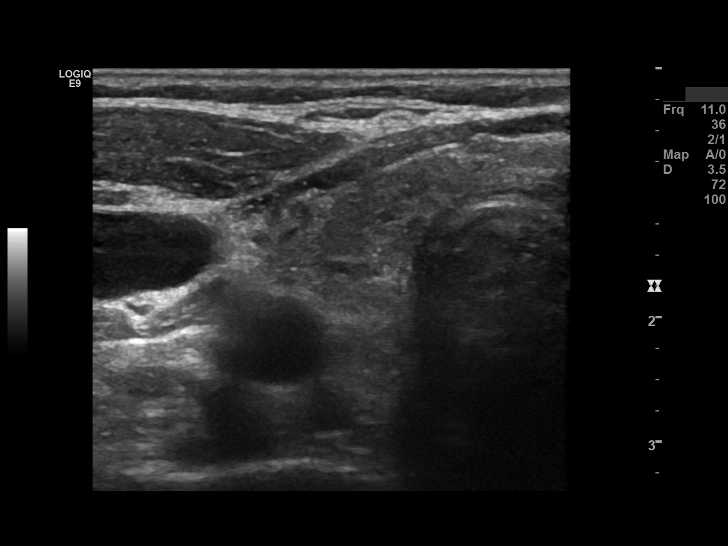
[im 7/38]
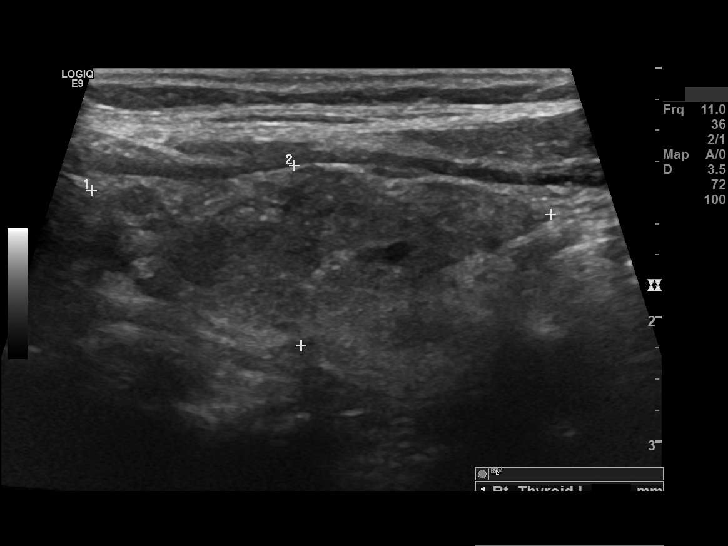
[im 10/38]
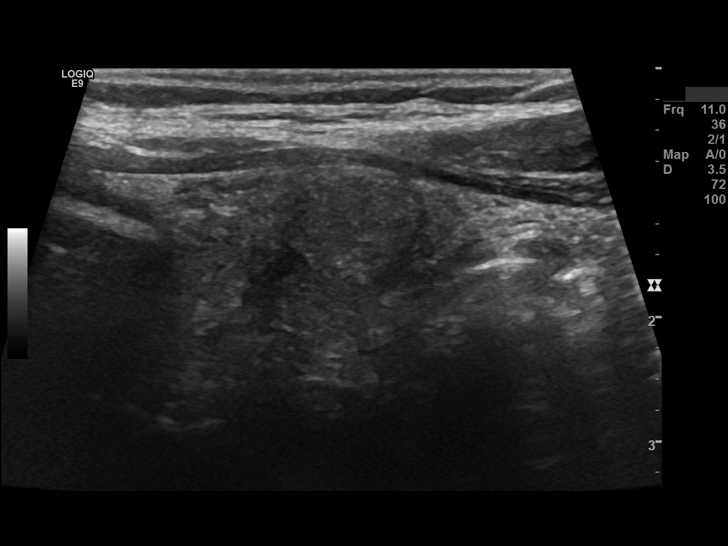
[im 13/38]
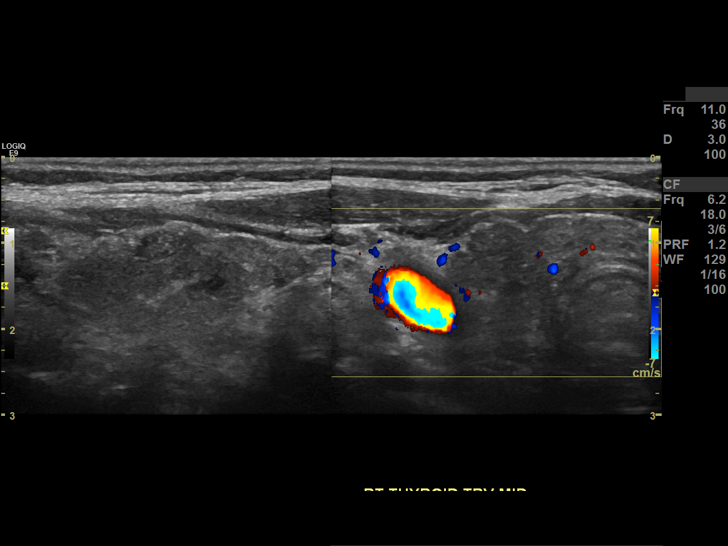
[im 16/38]
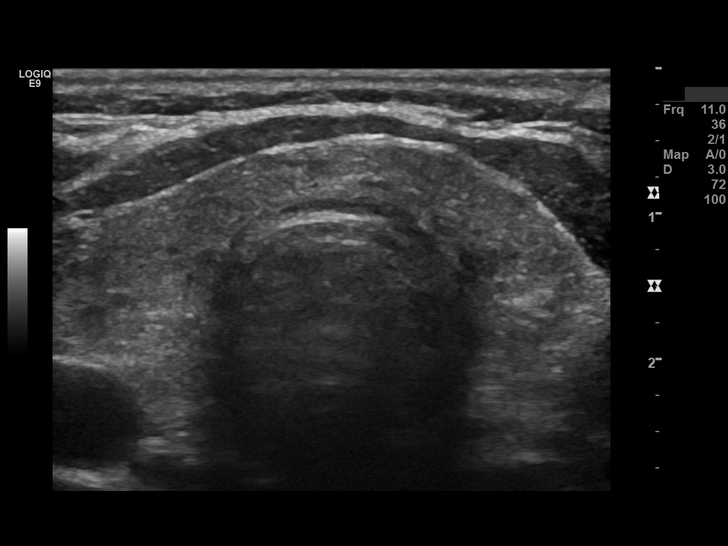
[im 19/38]
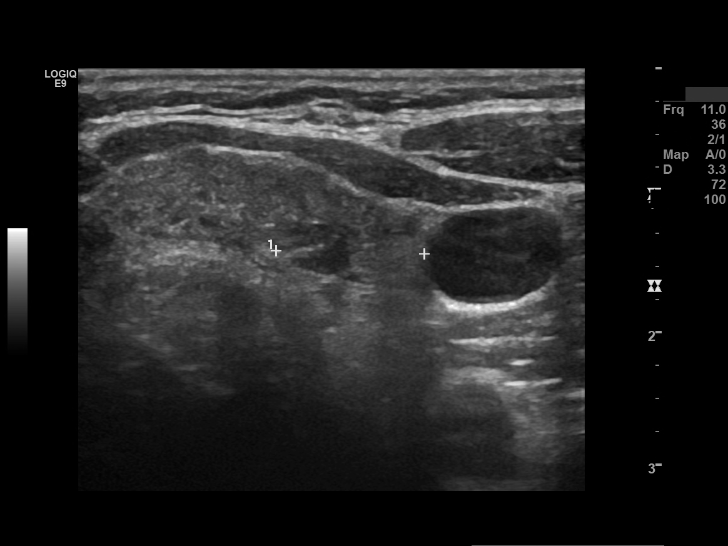
[im 22/38]
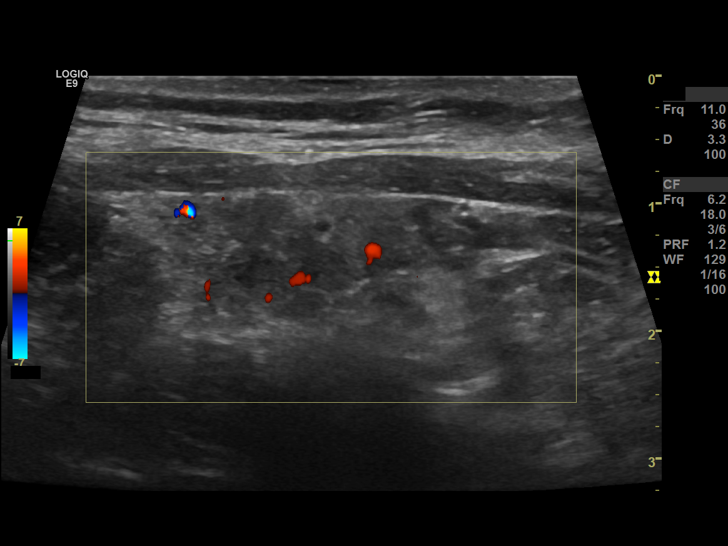
[im 25/38]
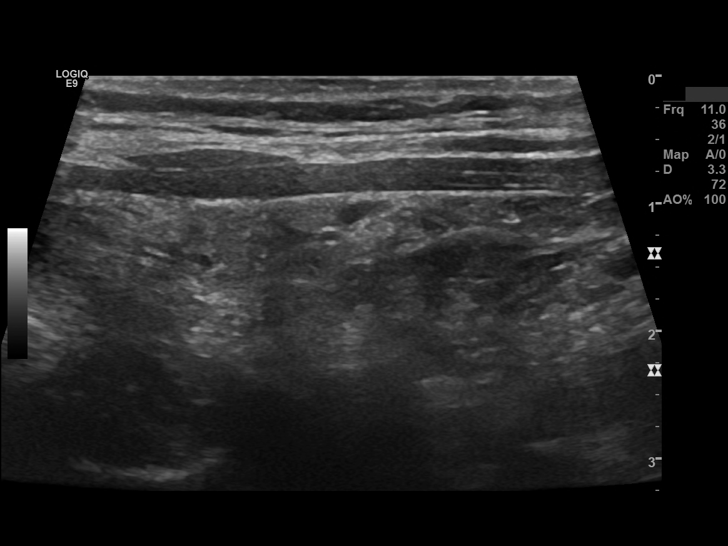
[im 28/38]
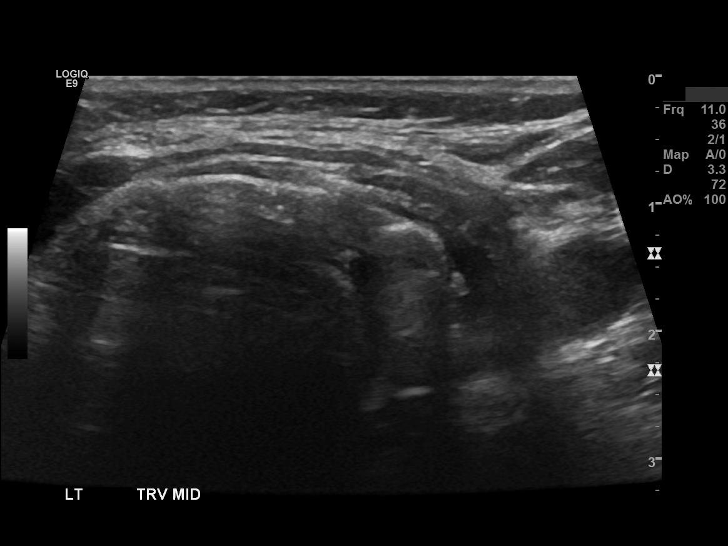
[im 31/38]
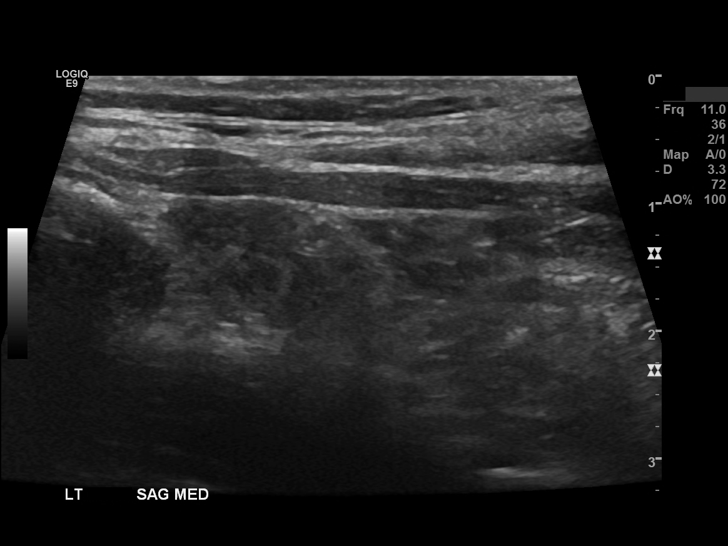
[im 34/38]
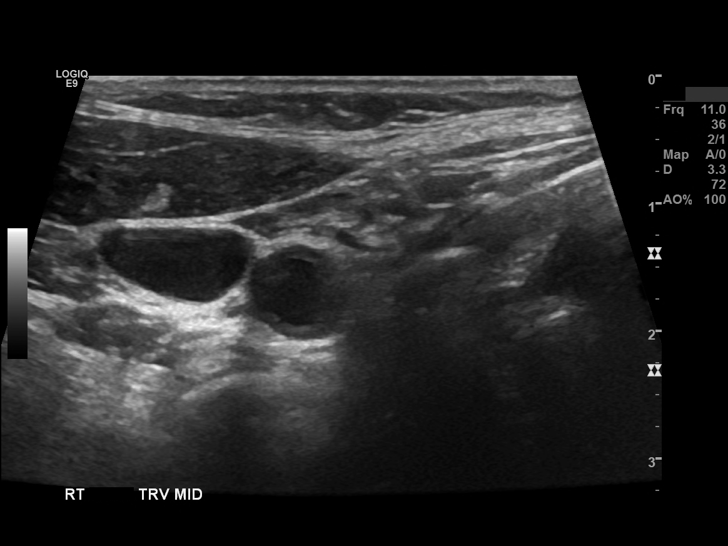
[im 38/38]
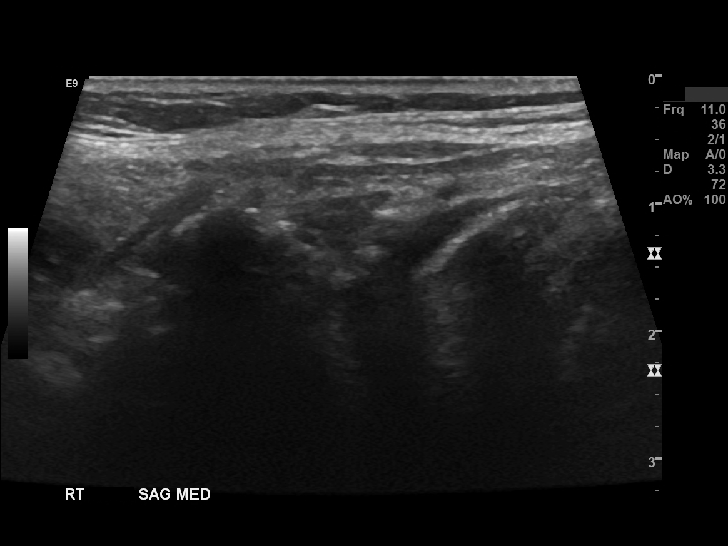

[13 of 25 positions shown; findings below may reference images not displayed]

FINDINGS: GLAND SIZE:

RIGHT LOBE: 37 x 14 x 13 mm;  Previous exam: 38 x 16 x 18 mm.

LEFT LOBE: 33 x 11 x 11 mm;  Previous exam: 33 x 9 x 13 mm.

ISTHMUS: 3 mm;  Previous exam: 4 mm

THYROID NODULES:

RIGHT LOBE:    

1. Mid zone solid nodule, 6 x 5 x 4 mm;  TI-RADS score 3; Previous exam: 9 x 6 x 7 mm.

LEFT LOBE: No nodule is present.

THYROID ISTHMUS: No nodule is present.

MISCELLANEOUS FINDINGS: Heterogeneous gland echotexture is seen with normal gland vascularity and no adjacent lymphadenopathy or mass.
IMPRESSION: Interval decrease in size of nodule in right thyroid lobe; recommend follow-up with ultrasound in 2 years using TI-RADS recommendation.

ACR TI-RADS recommendations for nodules:

TR5 (>= 7 points - FNA if > 1 cm, follow up if 0.5-0.9 cm every year for 5 years

TR4 (4-6 points) - FNA if >=1.5 cm, follow up if 1-1.4 cm in 1, 2, 3, and 5 years

TR3 (3 points) - FNA if 2.5 cm, follow up if 1.5-2.4 cm in 1, 3, and 5 years

TR2 (2 points) & TR1 (0 points) - No FNA or follow up

## 2017-10-04 IMAGING — MG MAMMO SCRN BIL W/CAD TOMO
8 series · 8 of 24 positions shown · non-contrast
Comparison: none

Images Obtained from [facility]
HISTORY: Patient is 71 years old and is seen for screening. The patient has no personal history of cancer. The patient has the following family history of breast cancer:  mother, breast cancer.
FILMS COMPARED:
The present examination has been compared to prior imaging studies.
TECHNIQUE: Bilateral 2-D digital screening mammogram was performed followed by 3-D tomosynthesis.  Current study was also evaluated with a computer aided detection (CAD) system.
MAMMOGRAM FINDINGS:
The breasts are almost entirely fatty.
No suspicious abnormality is seen in either breast.  There are no significant changes from the prior study.

[L CC]
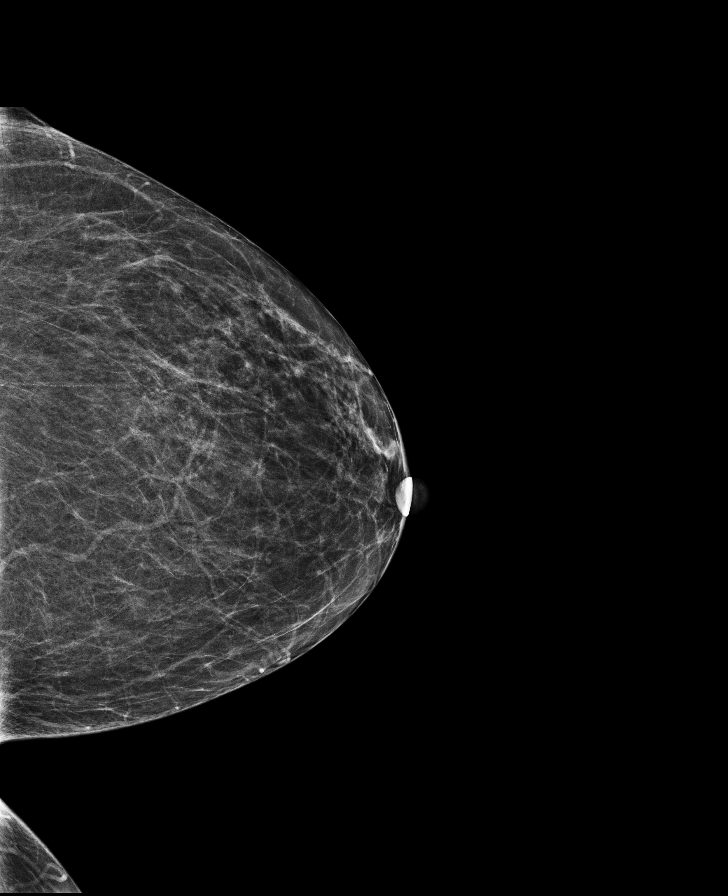

[L MLO]
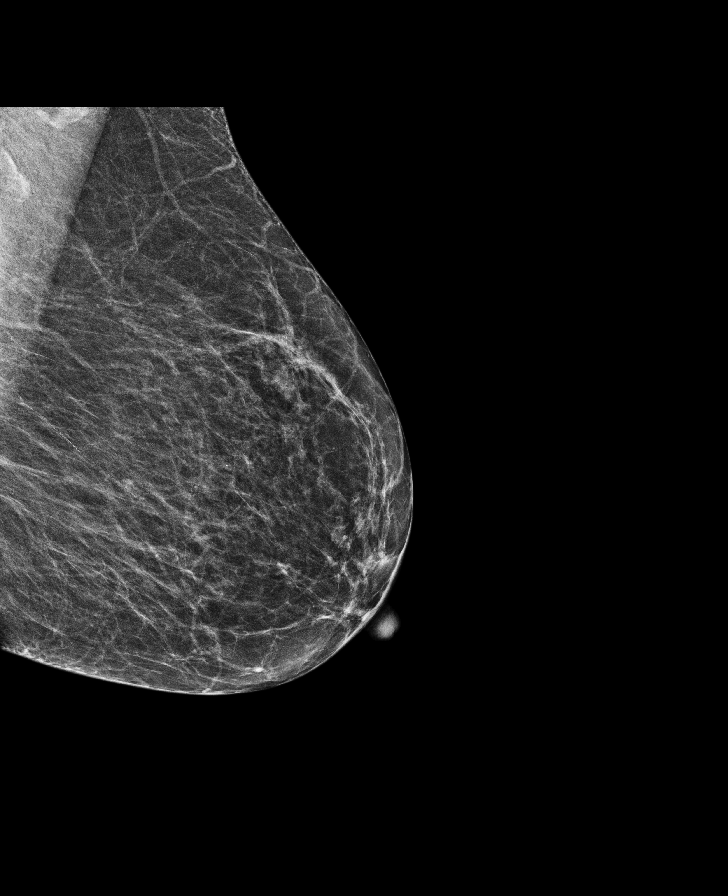

[R CC]
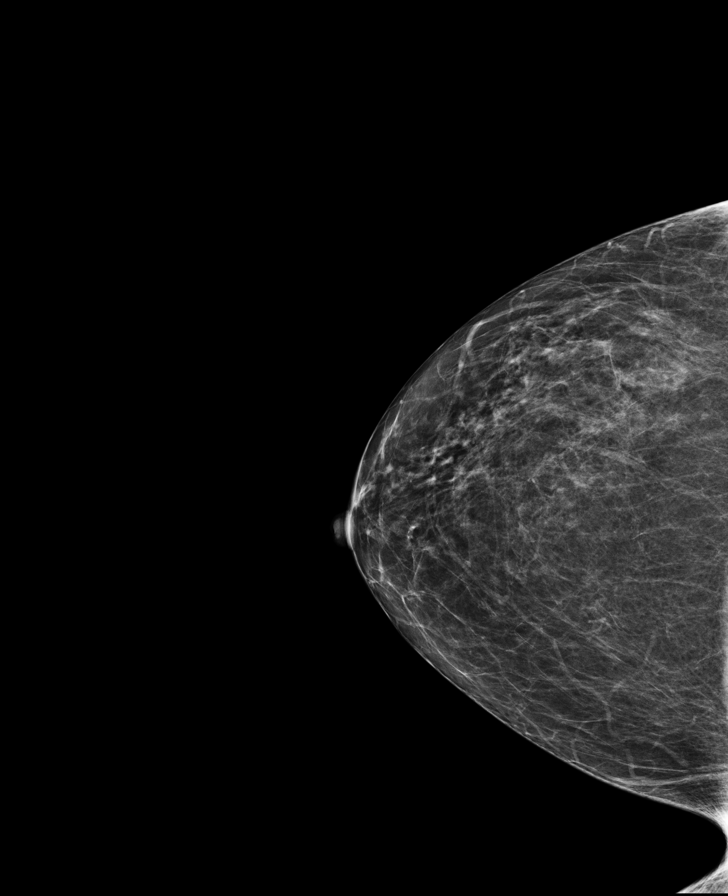

[R MLO]
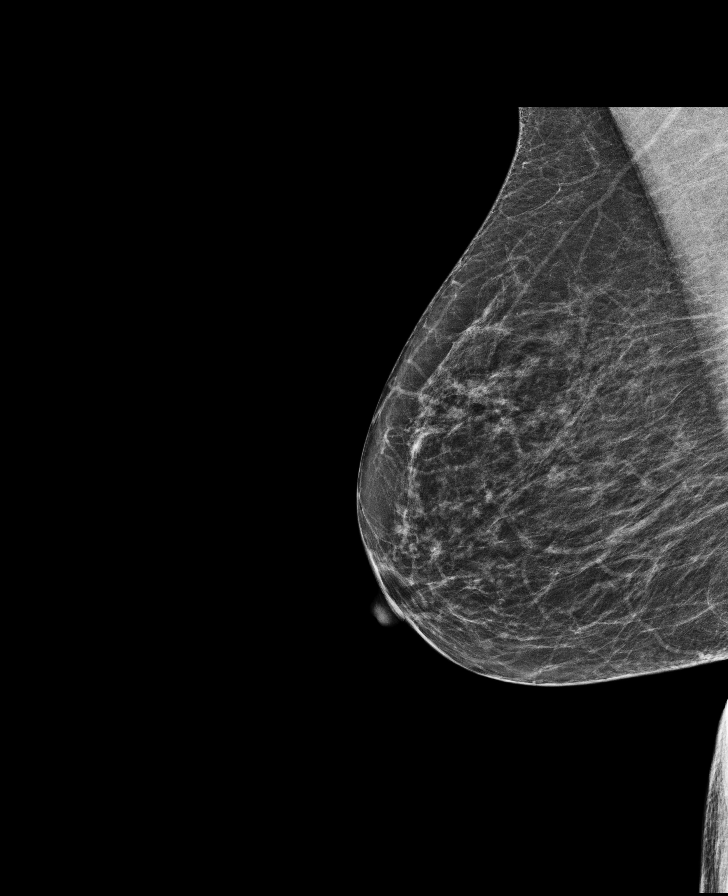

[L CC tomo · tomo slice 27/53.0]
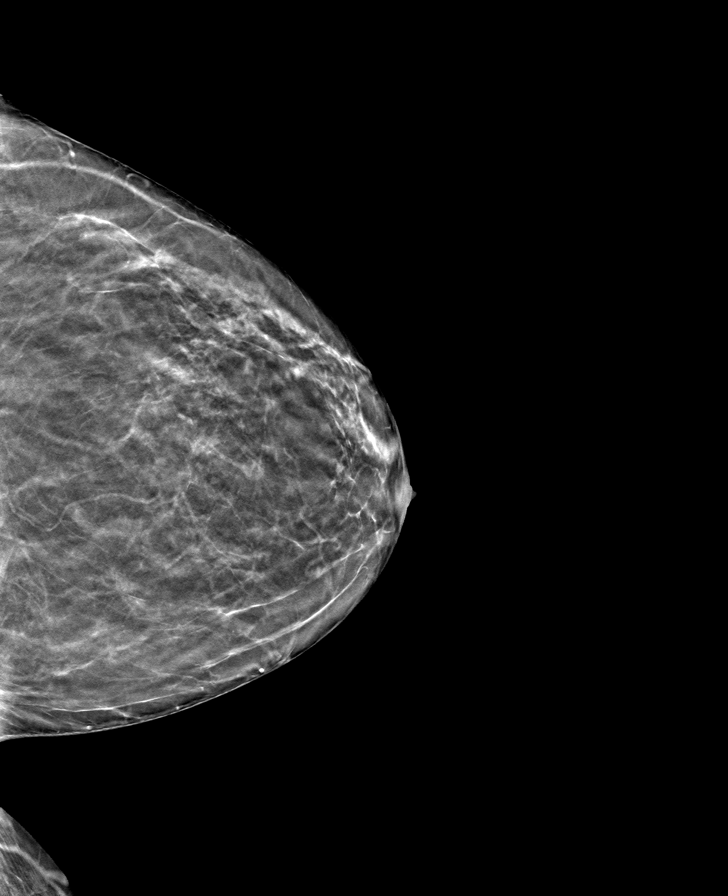

[R CC tomo · tomo slice 27/52.0]
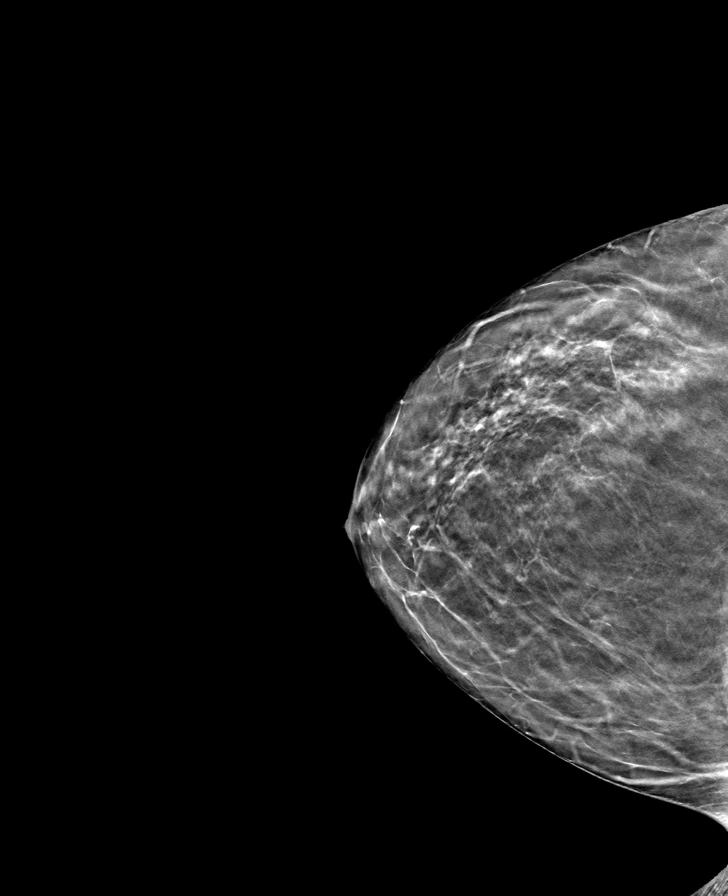

[R MLO tomo · tomo slice 27/54.0]
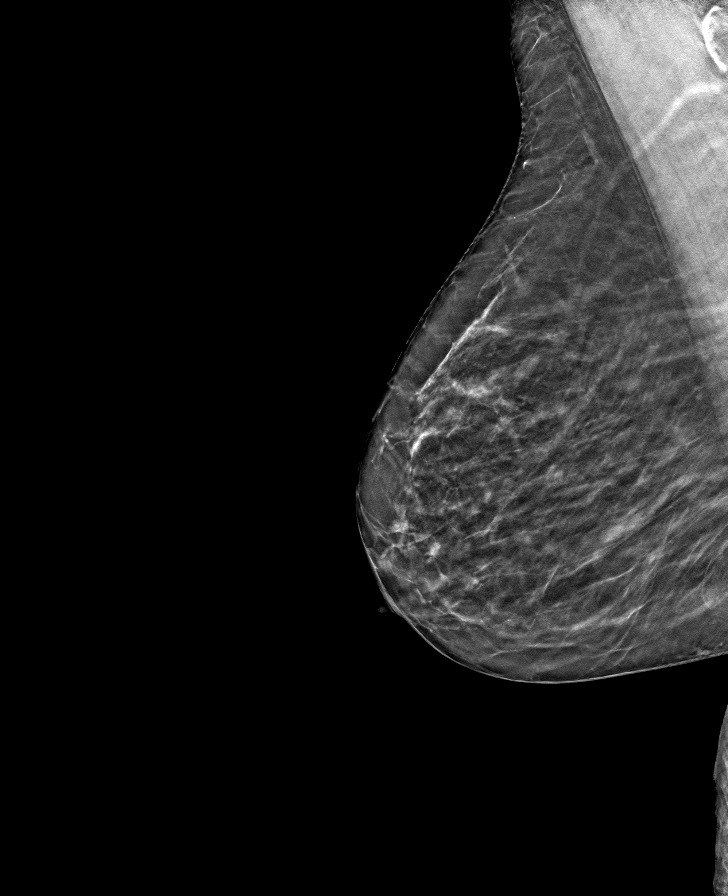

[L MLO tomo · tomo slice 27/54.0]
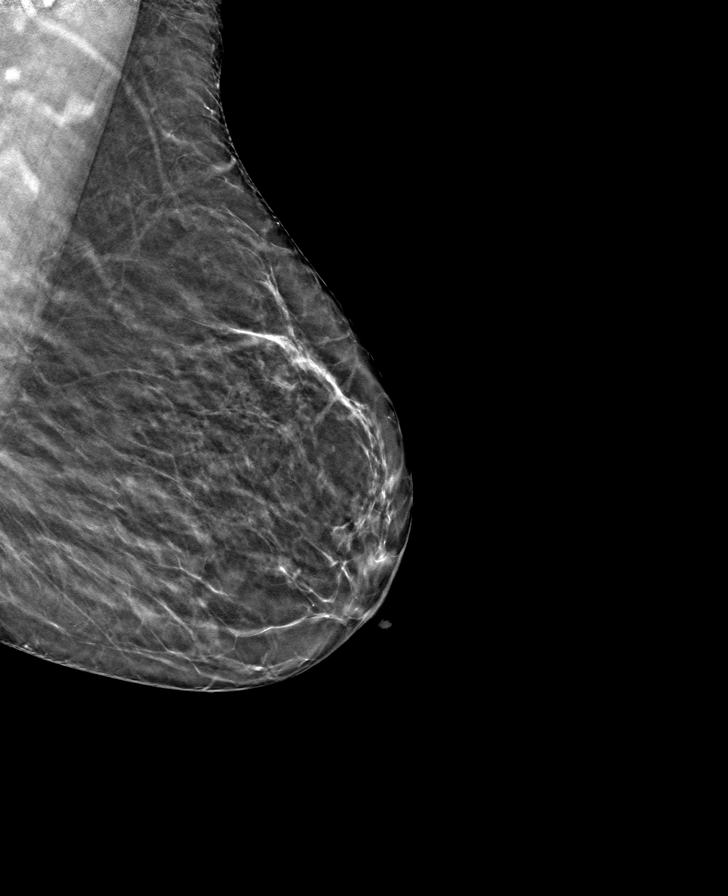

[8 of 24 positions shown; findings below may reference images not displayed]

IMPRESSION: There is no mammographic evidence of malignancy.
Screening mammogram recommended in 1 year.
BI-RADS Category 1: Negative

## 2017-11-17 ENCOUNTER — Telehealth

## 2017-11-29 ENCOUNTER — Ambulatory Visit

## 2017-12-04 ENCOUNTER — Telehealth

## 2017-12-04 NOTE — Progress Notes
Interval history:  Patricia Ball and I had a telemedicine encounter today. No problems or complaints have arisen since our last visit. 3 month have passed since surgery for a 1.5 cm primary papillary thyroid carcinoma on the right side with 2 microscopically positive inferior midline nodes.    Her thyroid hormone regimen was recently changed as below. She did not receive radioactive iodine.        Physical examination:  Reveals a normal voice and a nicely healed wound.    Impression:  I am satisfied with the patient's progress. I would like to see the patient in person in 9 months for an ultrasound.

## 2018-05-03 IMAGING — US US RENAL W/DOPPLER RENAL
1 series · 14 of 25 positions shown · non-contrast
Comparison: None

Retroperitoneal ultrasound complete with Doppler renal.
INDICATION: Disorder of kidney and ureter

[Series 1: us renal w/doppler renal · 14 of 42 slices shown]
[im 1/42]
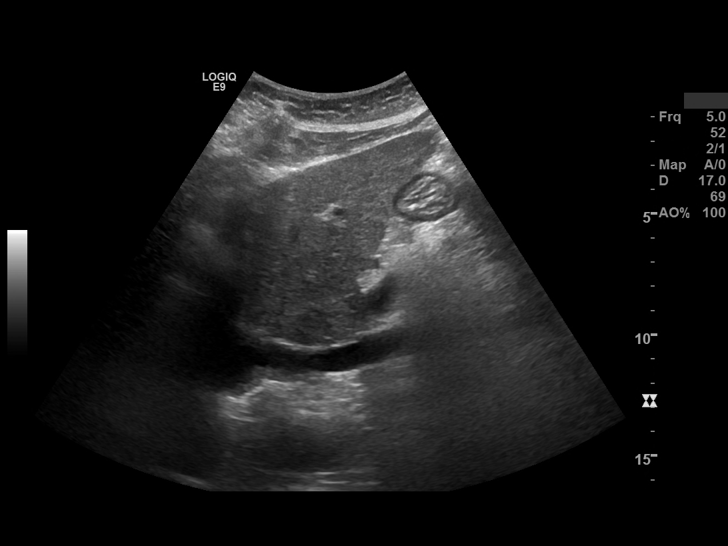
[im 4/42]
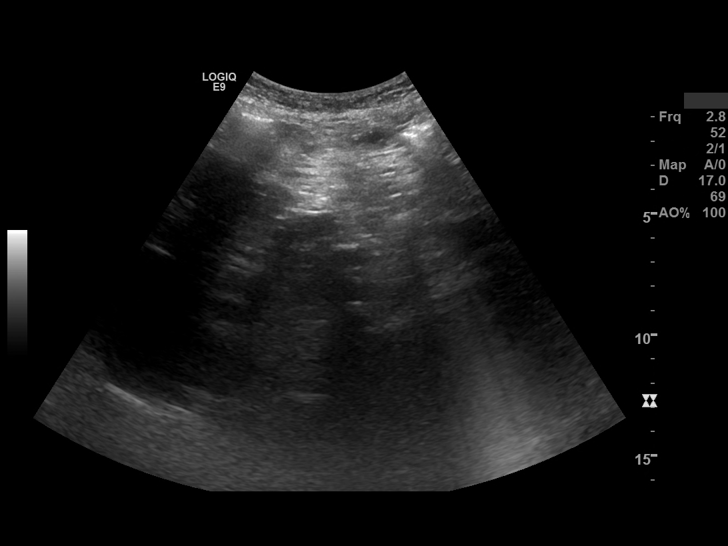
[im 7/42]
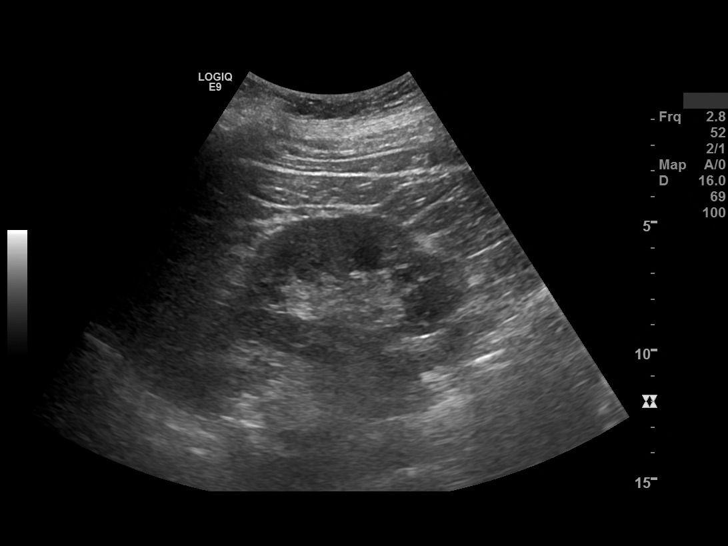
[im 11/42]
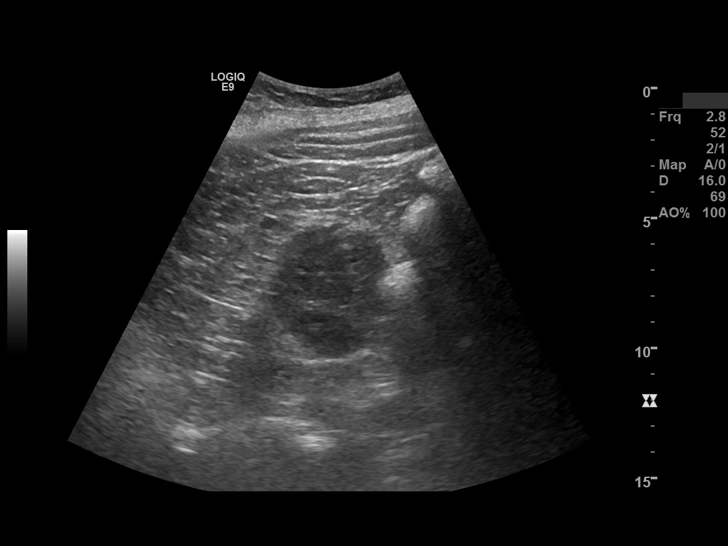
[im 14/42]
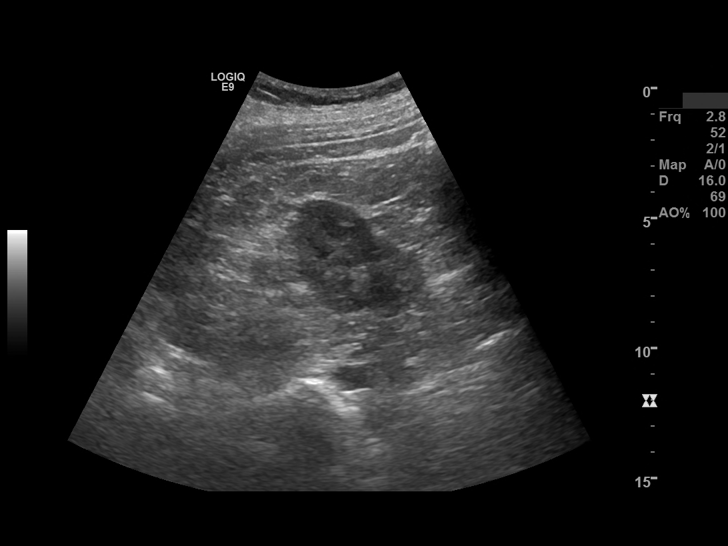
[im 16/42]
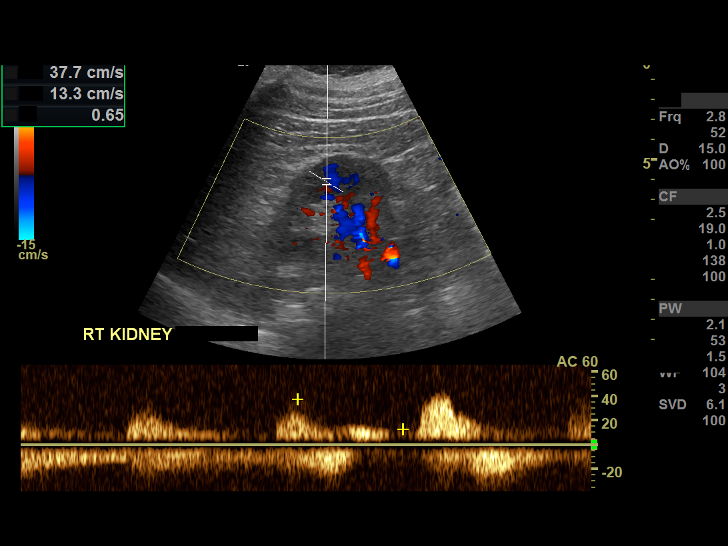
[im 19/42]
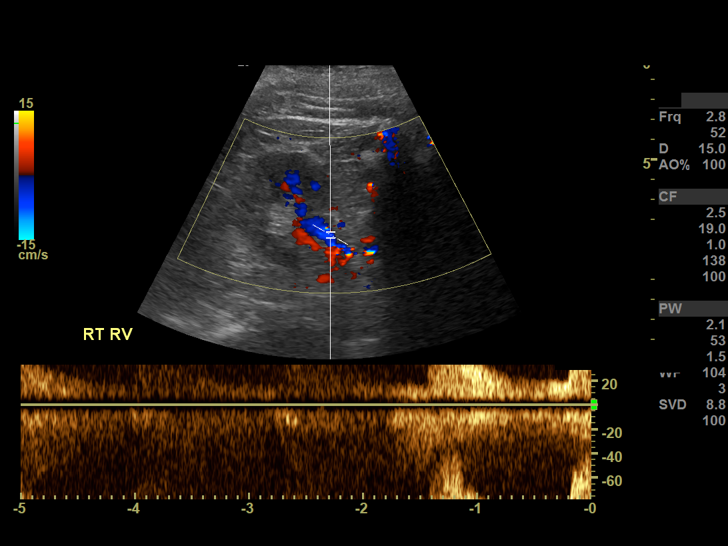
[im 23/42]
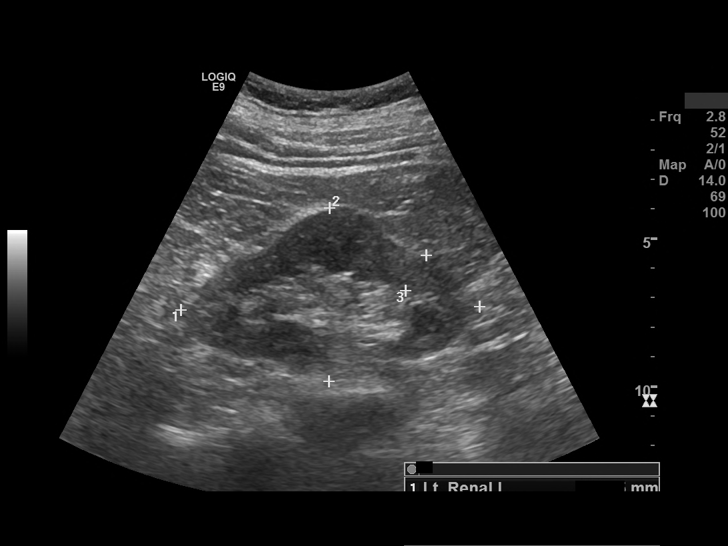
[im 26/42]
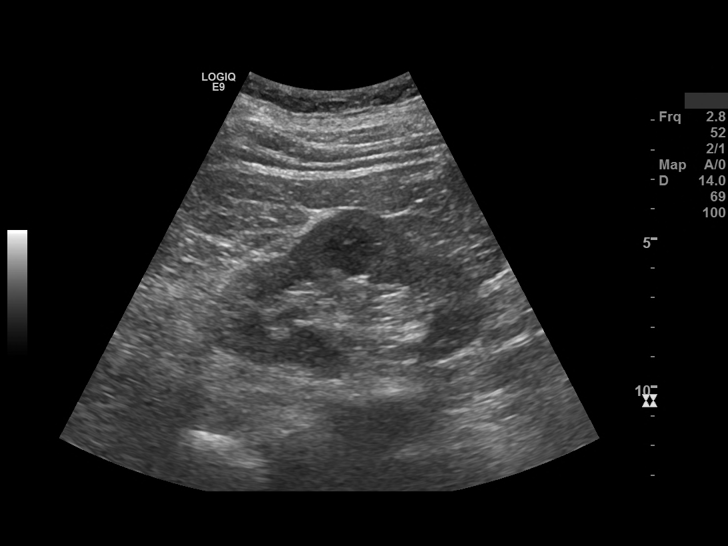
[im 28/42]
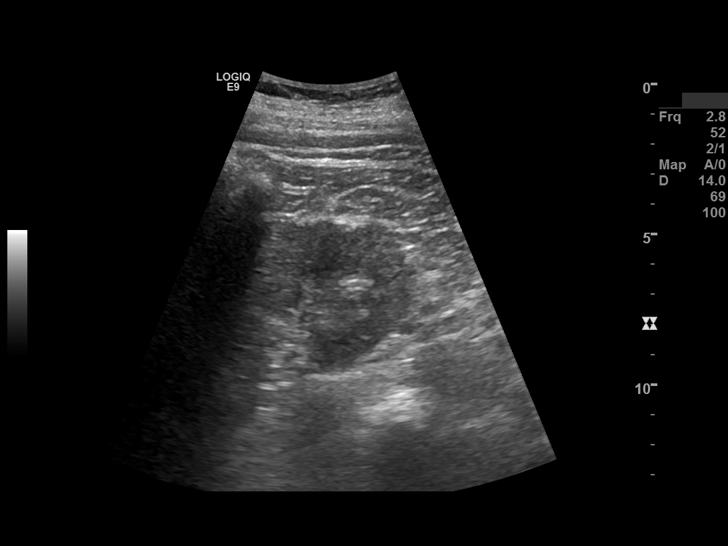
[im 31/42]
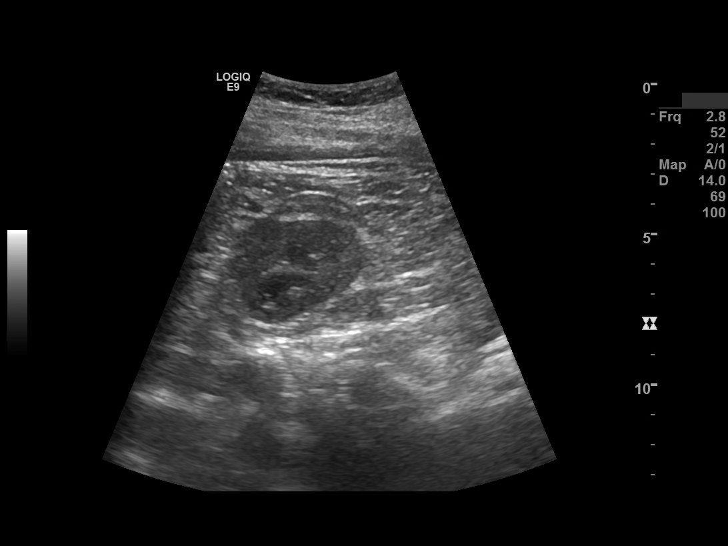
[im 35/42]
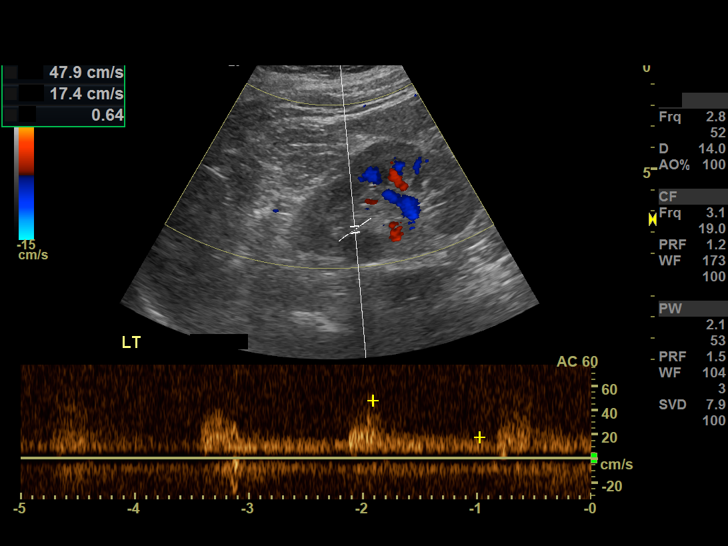
[im 38/42]
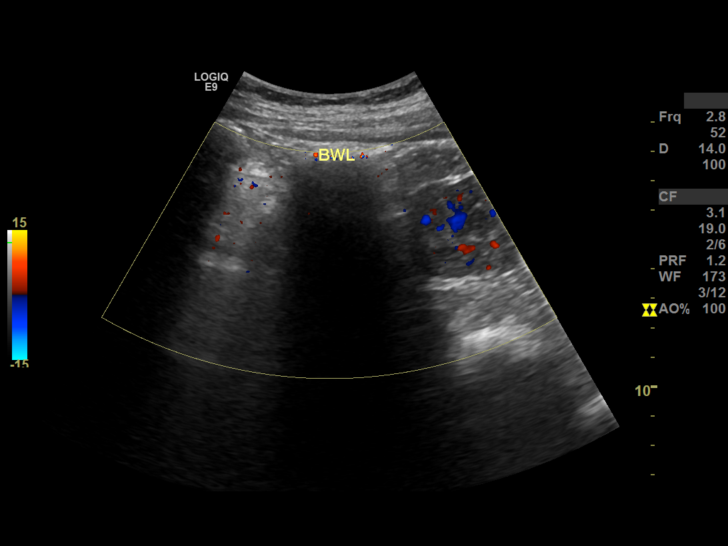
[im 42/42]
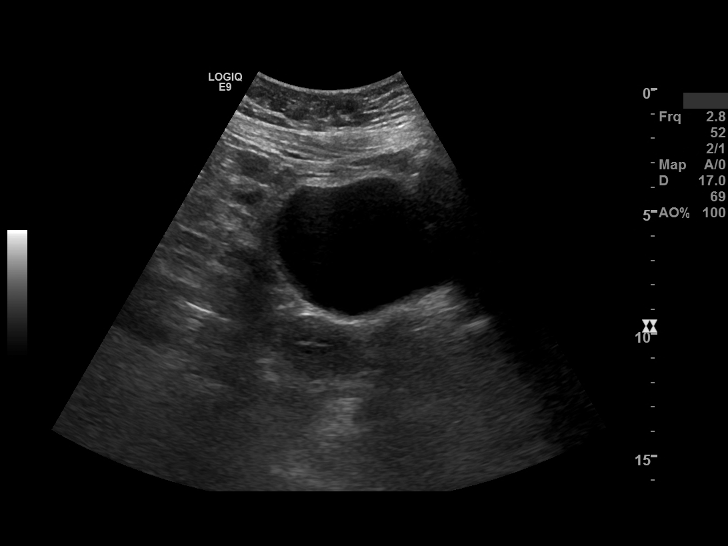

[14 of 25 positions shown; findings below may reference images not displayed]

FINDINGS: The kidneys look unremarkable in size and echotexture, the right 10.0 x 5.8 x 5.5 cm with 16 mm cortex, left 10.1 x 5.9 x 5.3 cm with 14 mm cortex. I see no hydronephrosis nor visible lesion. Bladder looks unremarkable.

Doppler: The renal artery origins are obscured by bowel gas and cannot be evaluated.

Distal renal artery to aorta ratios: 0.9 bilaterally.

Bilateral renal vein flow noted bilaterally.
IMPRESSION: 1.  Unremarkable kidneys.

2. Obscured proximal renal arteries although the distal renal artery flow ratios suggest less than 60% renal artery stenosis.

## 2019-03-21 IMAGING — MG MAMMO SCRN BIL W/CAD TOMO
8 series · 8 of 24 positions shown · non-contrast
Comparison: The present examination has been compared to prior imaging studies.

Images Obtained from [facility]
INDICATION: Screening.
TECHNIQUE: Bilateral 2-D digital screening mammogram was performed followed by 3-D tomosynthesis.  Current study was also evaluated with a computer aided detection (CAD) system.
MAMMOGRAM FINDINGS:
There are scattered areas of fibroglandular density.
No suspicious abnormality is seen in either breast.

[R MLO]
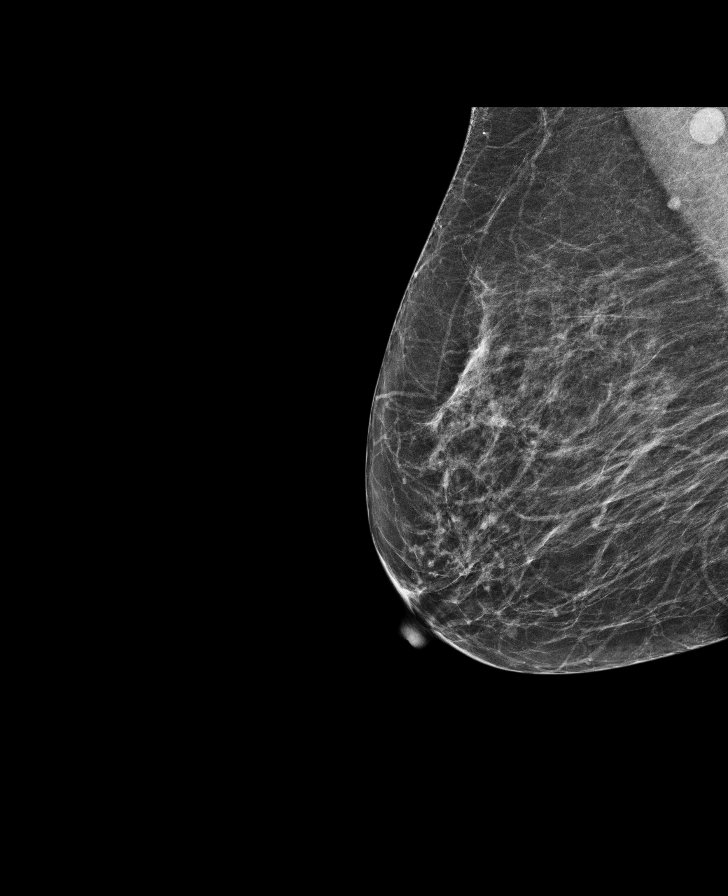

[L MLO]
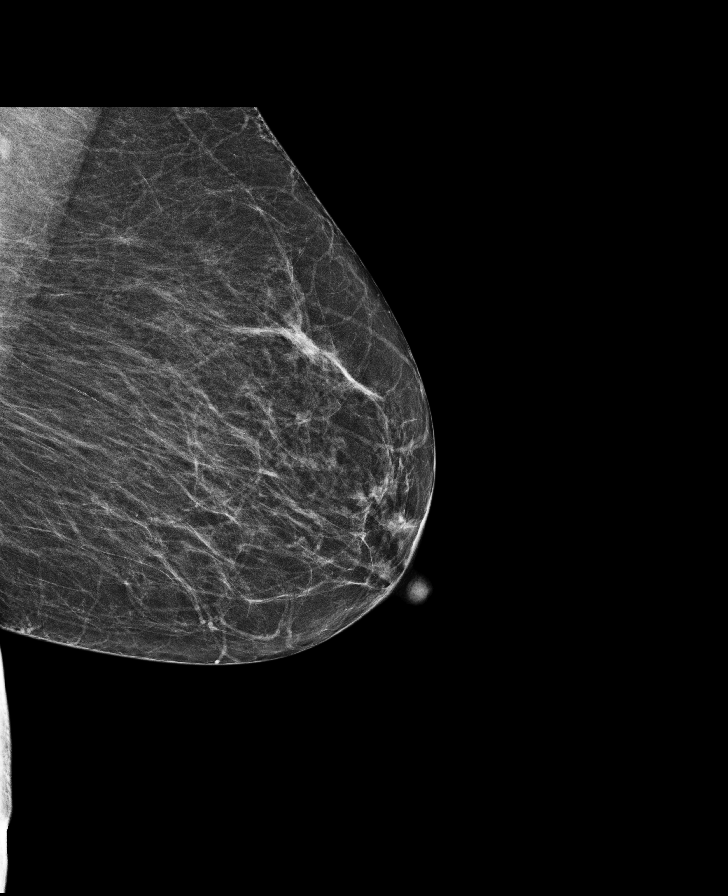

[L CC]
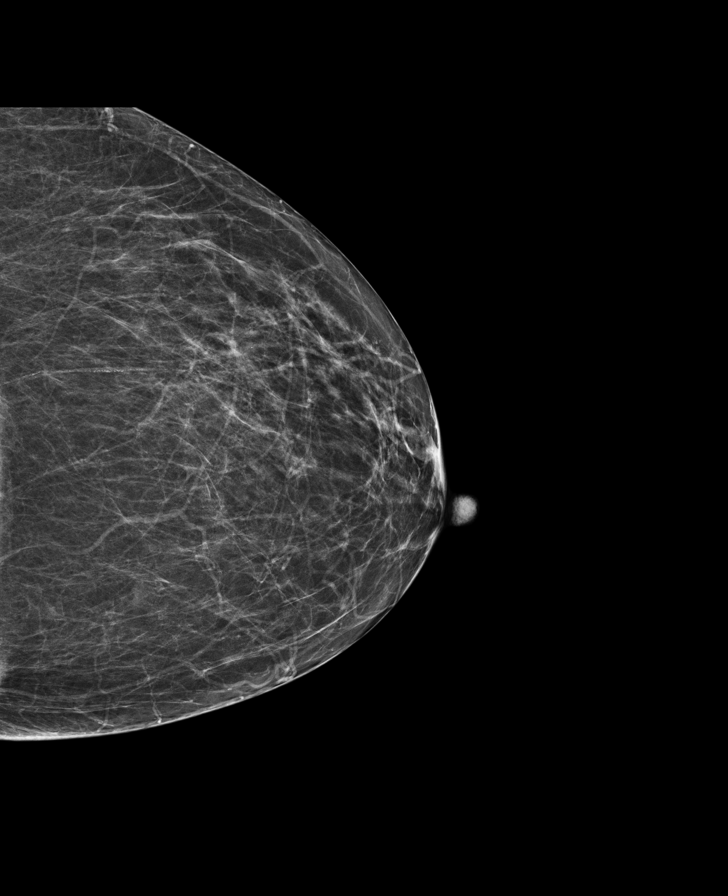

[R CC]
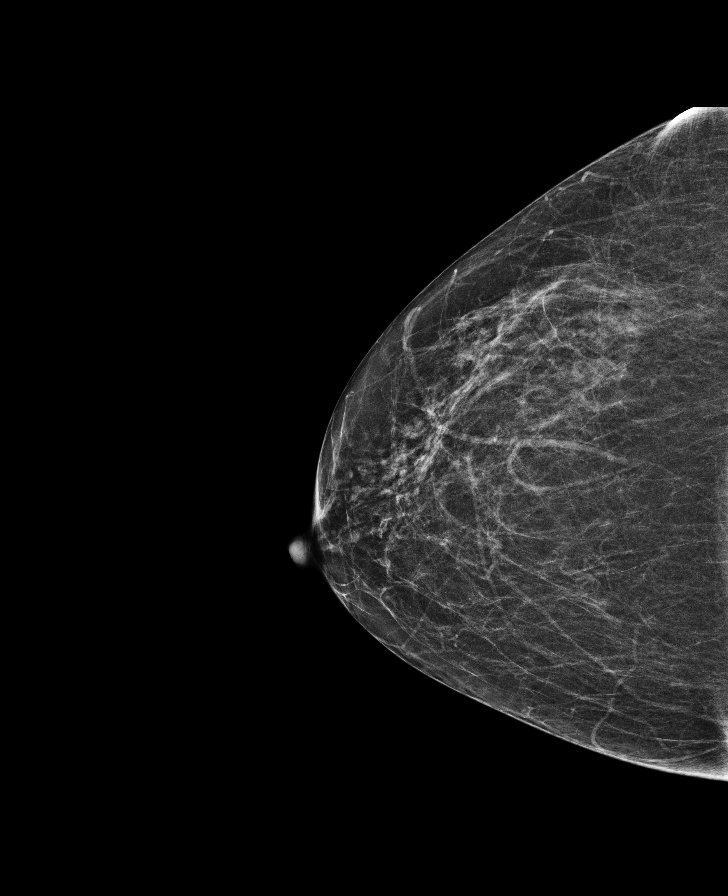

[L CC tomo · tomo slice 29/58.0]
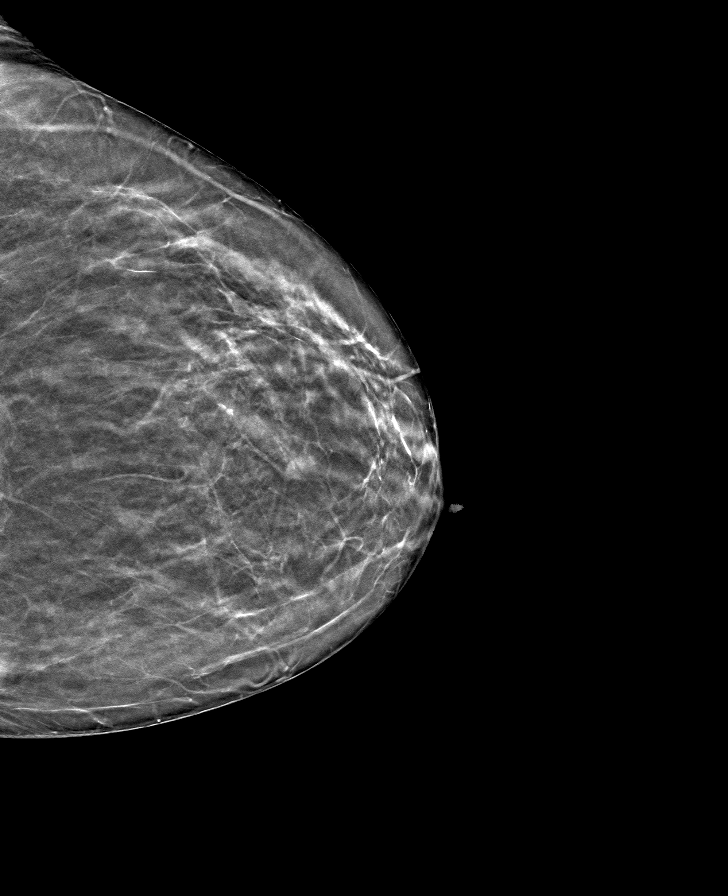

[L MLO tomo · tomo slice 31/62.0]
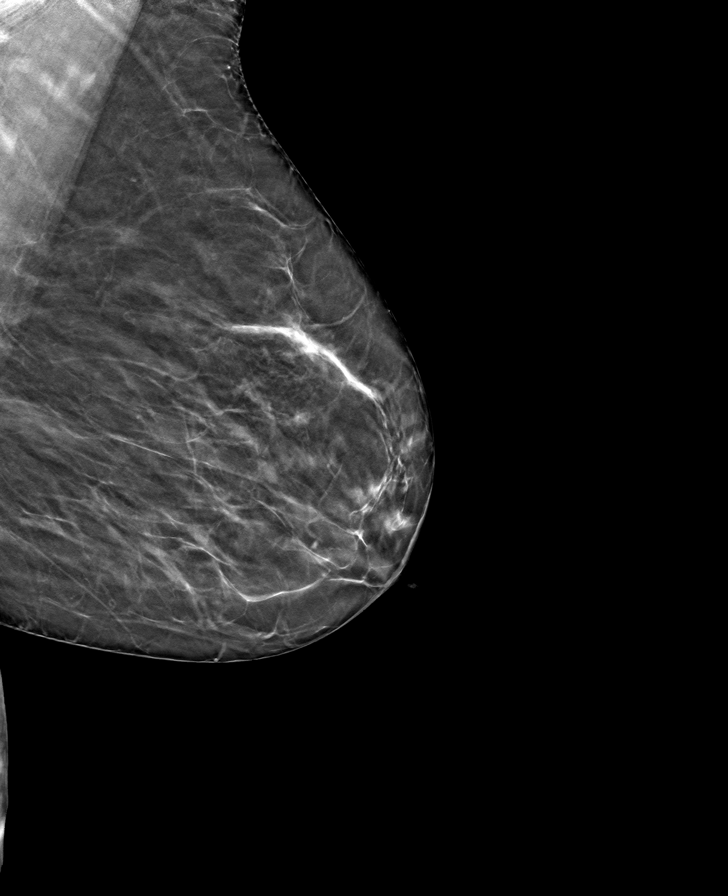

[R MLO tomo · tomo slice 31/60.0]
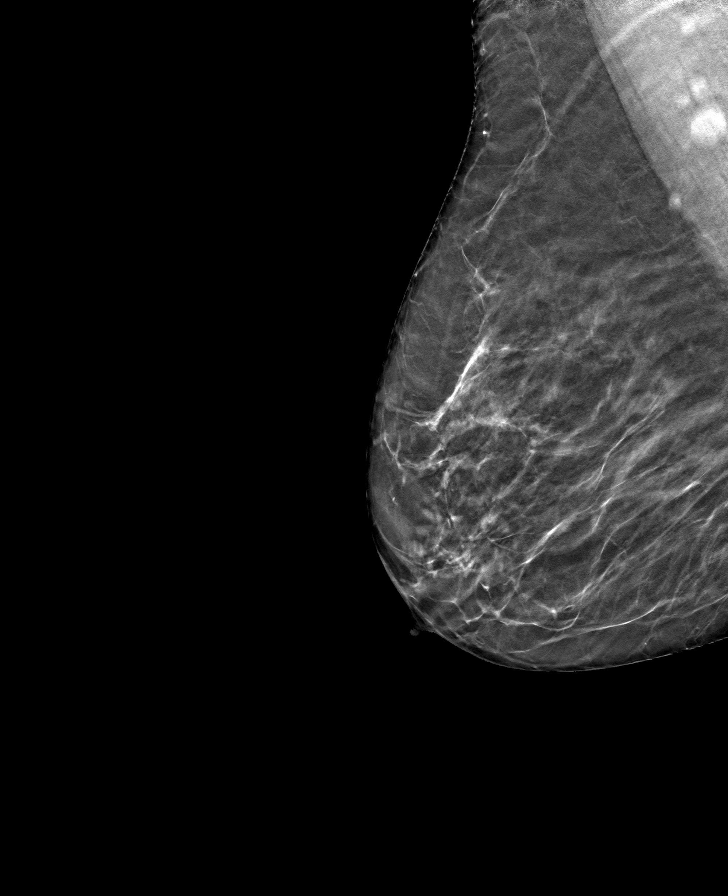

[R CC tomo · tomo slice 30/59.0]
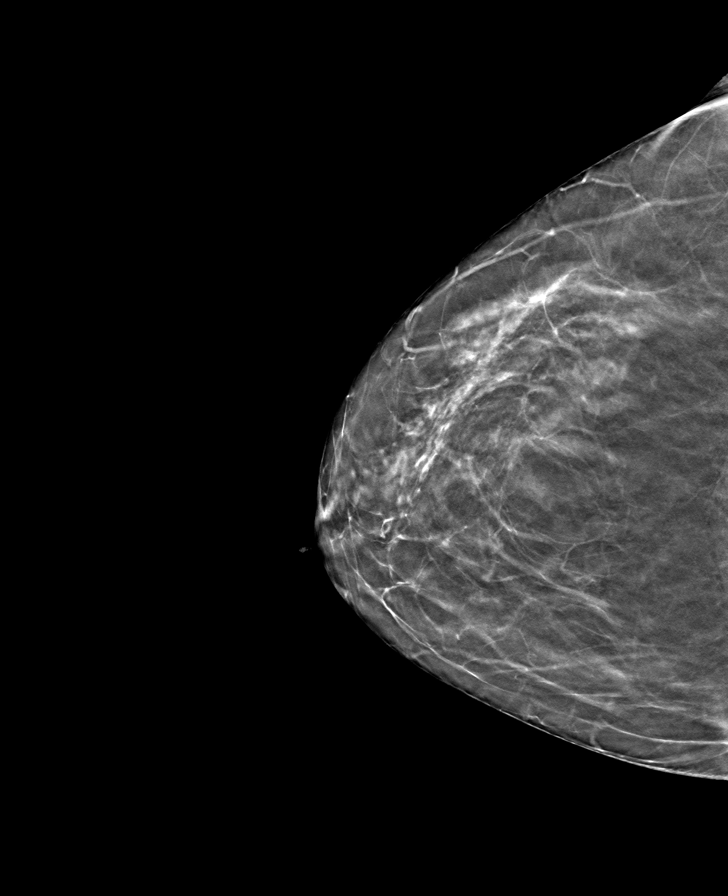

[8 of 24 positions shown; findings below may reference images not displayed]

IMPRESSION: There is no mammographic evidence of malignancy.
Screening mammogram recommended in 1 year.
BI-RADS Category 1: Negative

## 2019-03-21 IMAGING — OT DXA BONE DENSITY
1 series · 1 of 1 positions shown · non-contrast
Comparison: none

REASON FOR EXAM: Evaluate bone density.

[Series 1: — · left · 1 of 1 slices shown]
[im 1/1]
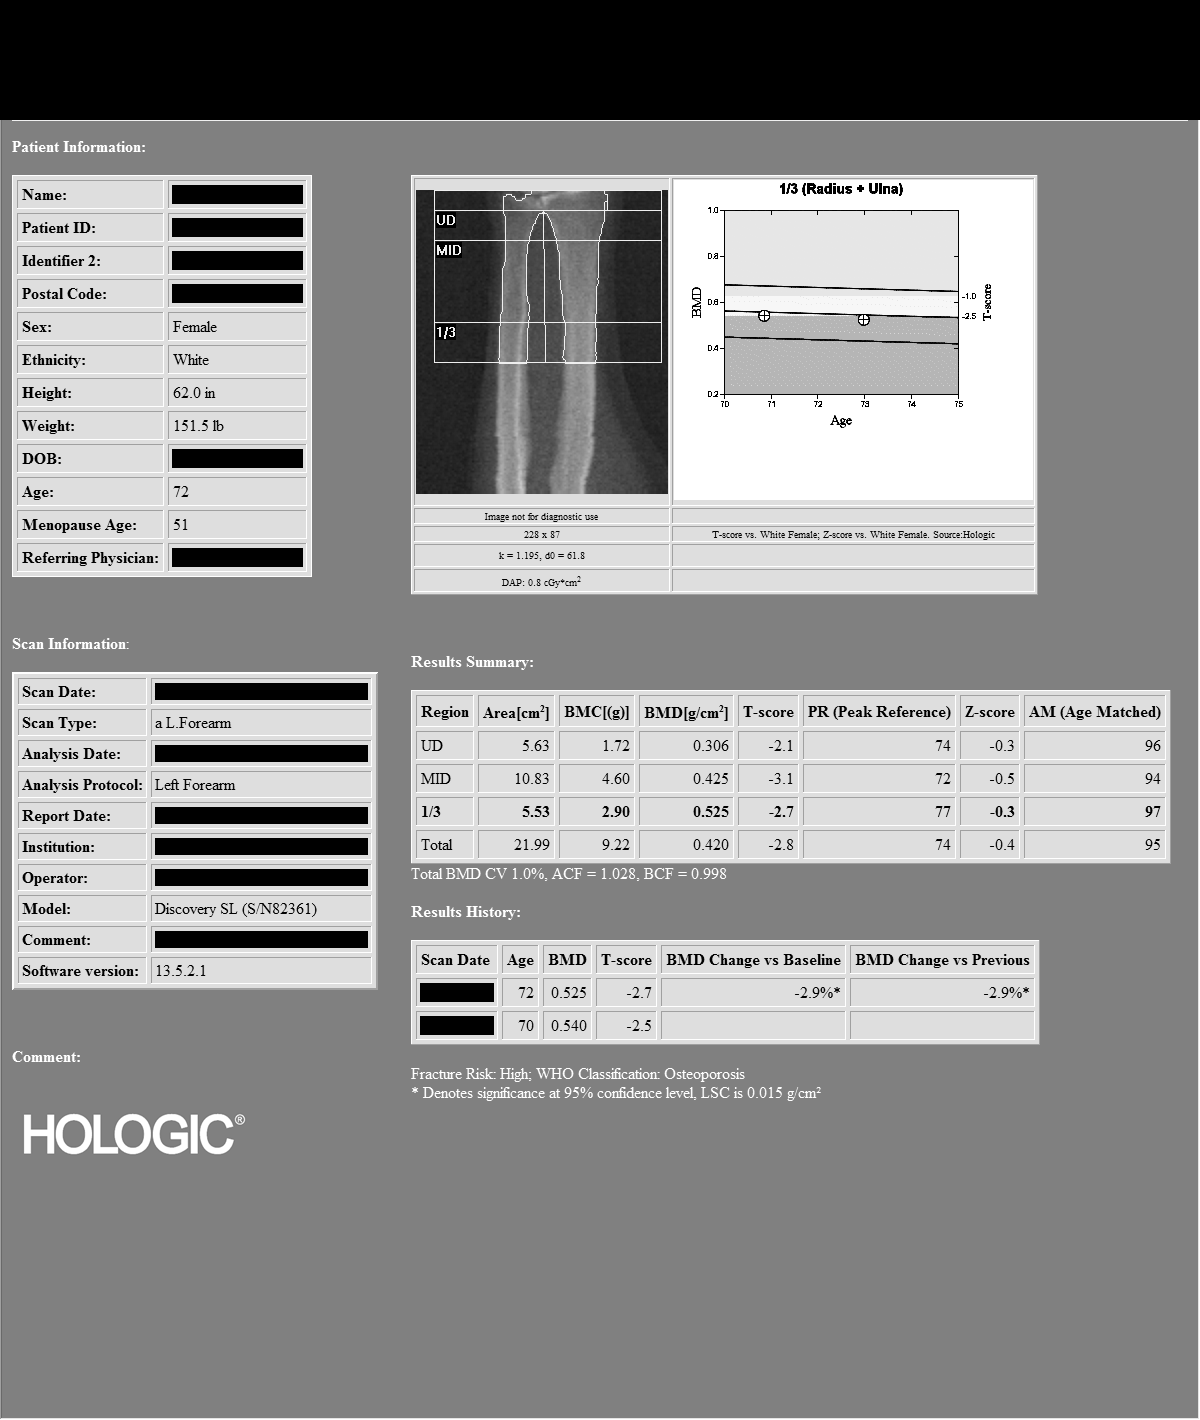

[1 of 1 positions shown; findings below may reference images not displayed]

IMPRESSION: As defined by World Health Organization, the patient meets the criteria for OSTEOPOROSIS based on distal left [DATE] radius T-score.

PATIENT DEMOGRAPHICS:  72-year-old white female.
RISK FACTORS:  Personal history of bronchial asthma. Glucocorticoid use.

PRIOR EXAMS:  DXA from [HOSPITAL] dated 02/01/2017 steroid therapy. Diagnosed as osteoporosis based on distal left [DATE] radius.

METHOD:  Scans of the forearm were performed using dual energy X-ray densitometry (DXA) with the Hologic Discovery SL ([id number])  system. The spine and the hips could not be used for the evaluation due to extensive lumbar surgery and bilateral hip prostheses. For this reason, the distal left one-third radius was used.
FINDINGS: The left forearm exam using [DATE] radius region of interest shows average Bone Mineral Density is 0.525 gm/cm2 of Hydroxyapatite. The T-score (comparing patient with a young adult group) is 2.7 standard deviations BELOW mean. The Z-score (comparing patient with an age-matched group) is 0.3 standard deviations BELOW mean.

COMPARED TO PRIOR DXA, forearm bone density was 0.540 gm/cm2.  This is an interval decrease of 0.015 gm/cm2 or -2.9%. Least significant change in the forearm is 0.015 gm/cm2.  This is not a statistically significant interval decrease.

RECOMMENDATIONS:  The patient states that she is taking supplements on a regular basis.  The patient should continue being a nonsmoker and regular exercise to patient tolerance would be of benefit.  The patient is currently not taking prescribed medication for prevention of bone loss.  According to criteria established by the National Osteoporosis Foundation, the patient DOES meet the current indications for prescribed medical therapy.  The National Osteoporosis Foundation now recommends followup DXA scanning every two years in patients at risk regardless of whether the patient is undergoing pharmacological treatment.

World Health Organization criteria for diagnosis, please see link below.

[URL]

## 2019-05-20 ENCOUNTER — Ambulatory Visit: Payer: MEDICARE

## 2019-05-20 DIAGNOSIS — Z23 Encounter for immunization: Secondary | ICD-10-CM

## 2019-07-29 IMAGING — US US BREAST BIL COMP (RALLP)
1 series · 14 of 24 positions shown · non-contrast
Comparison: The present examination has been compared to prior imaging studies.

HISTORY: Patient is 73 years old and is seen for diagnostic evaluation of pain in both breasts.
TECHNIQUE: Real-time ultrasound of both breasts was performed.  All four quadrants of the breast and the retroareolar region were examined.

ULTRASOUND FINDINGS:
No suspicious abnormality is seen in either breast.

[Series 1: us breast bil comp (rallp) · 14 of 24 slices shown]
[im 1/24]
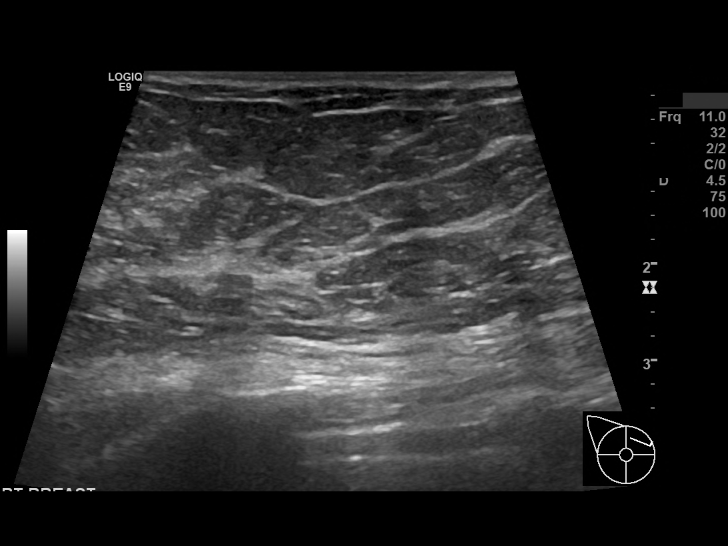
[im 3/24]
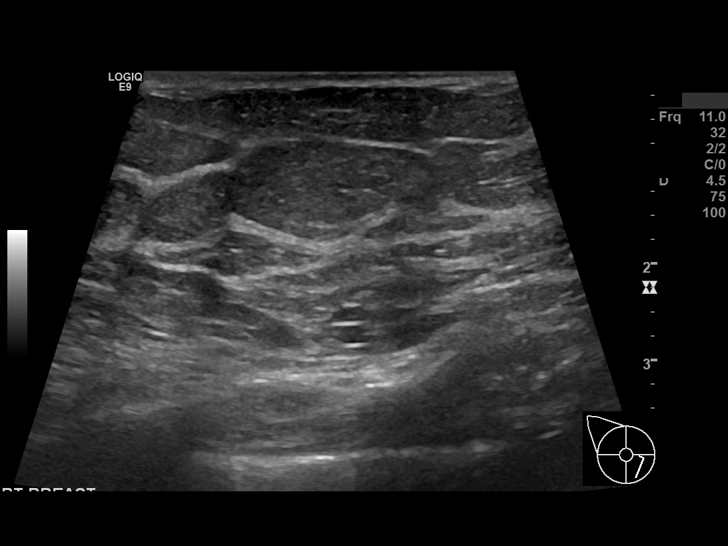
[im 5/24]
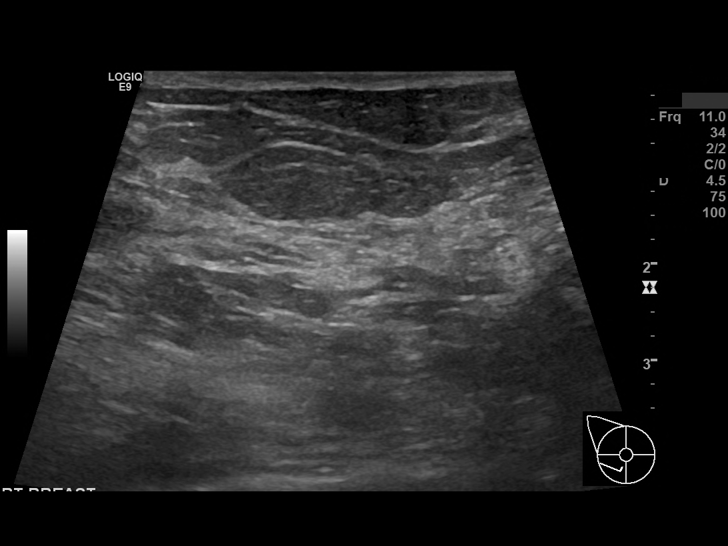
[im 7/24]
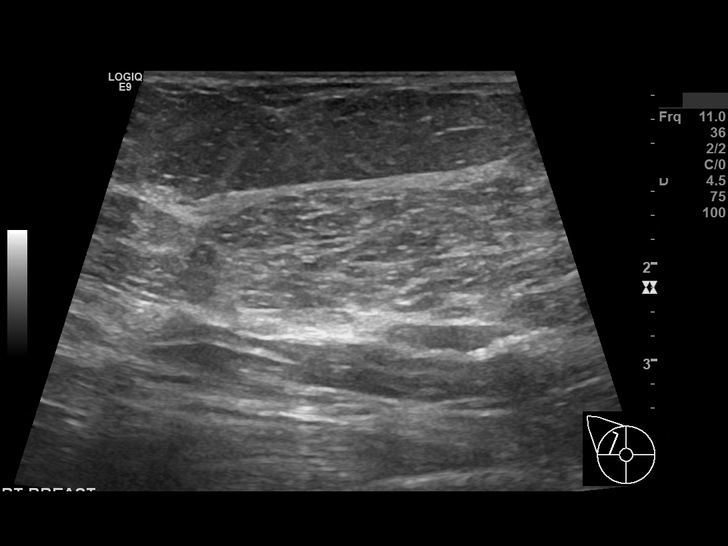
[im 8/24]
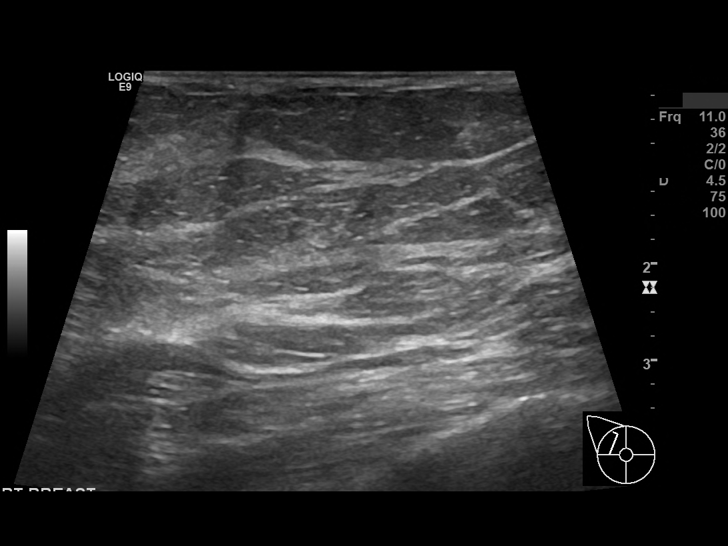
[im 10/24]
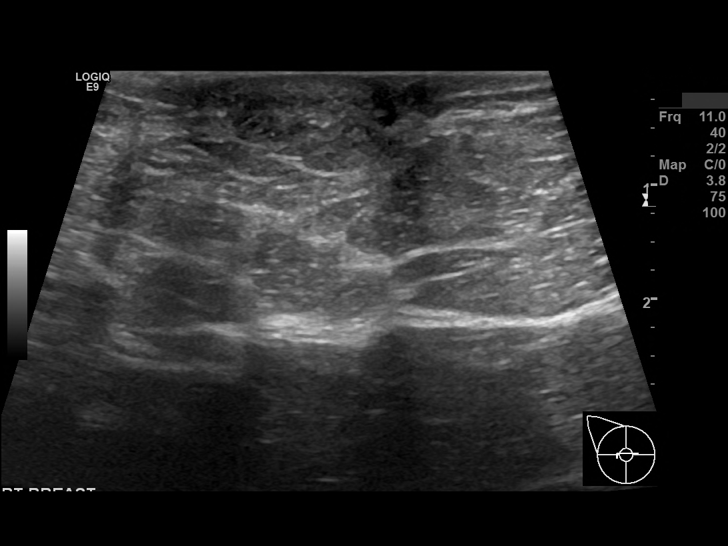
[im 12/24]
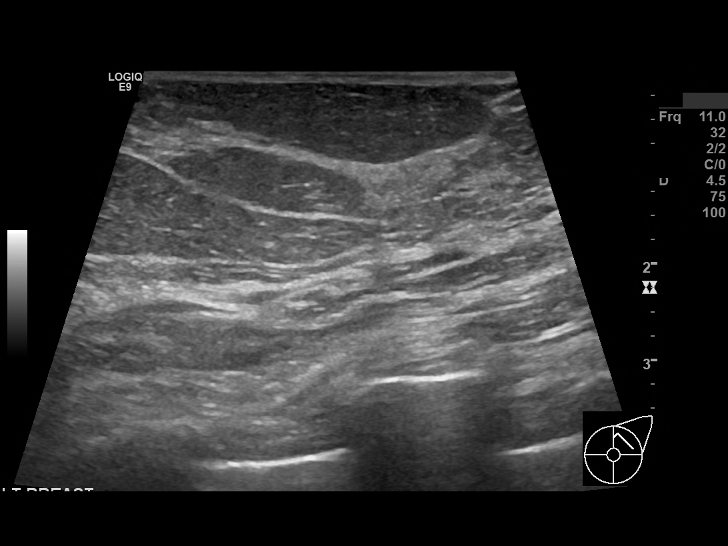
[im 13/24]
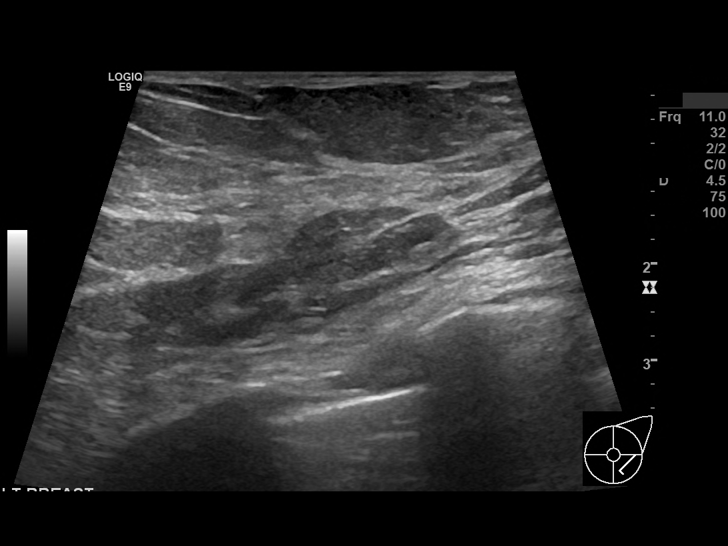
[im 15/24]
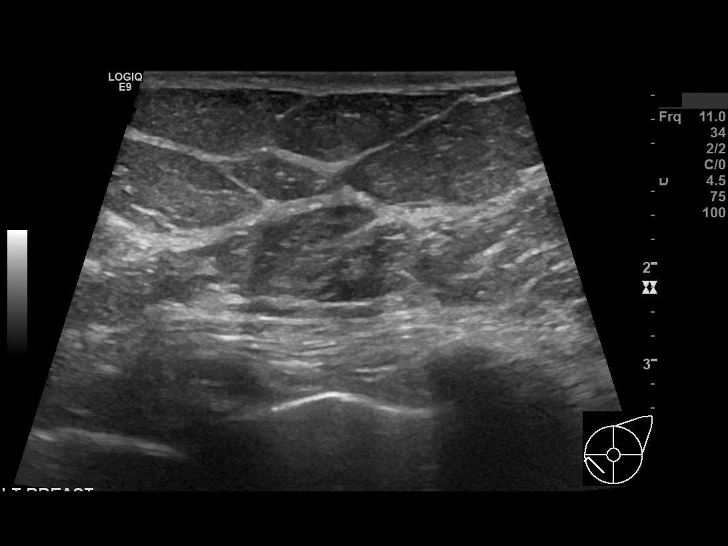
[im 17/24]
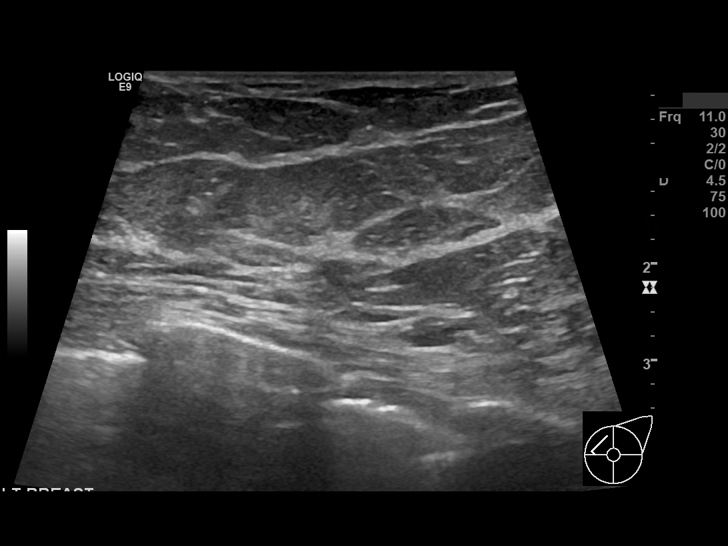
[im 19/24]
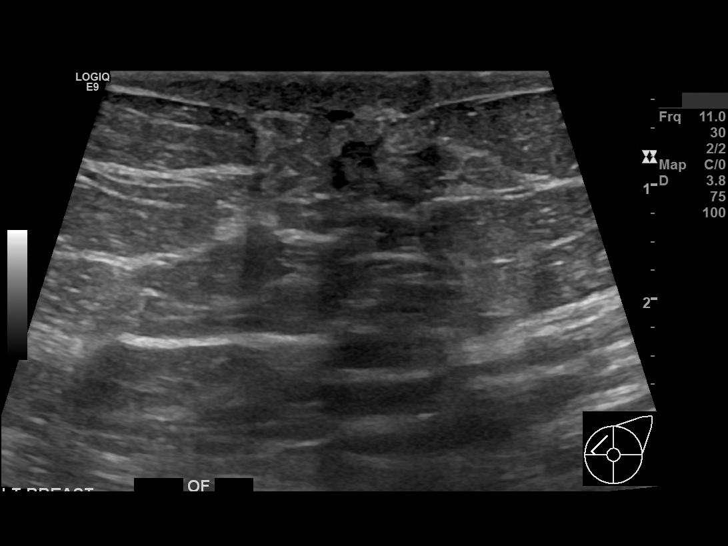
[im 20/24]
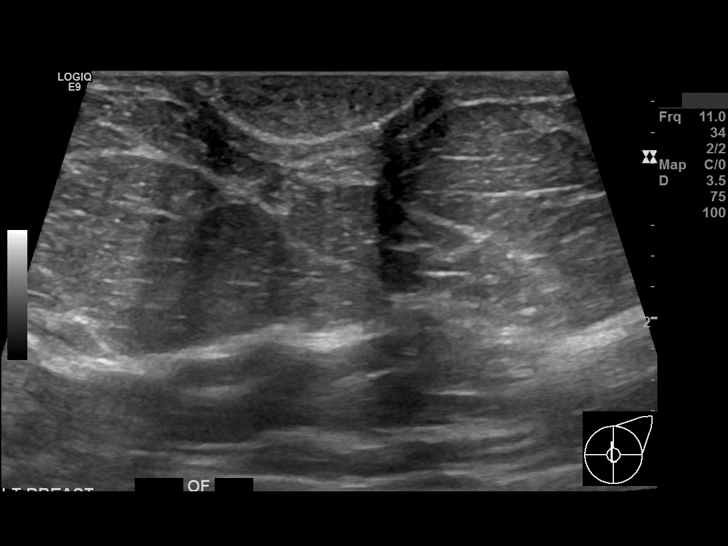
[im 22/24]
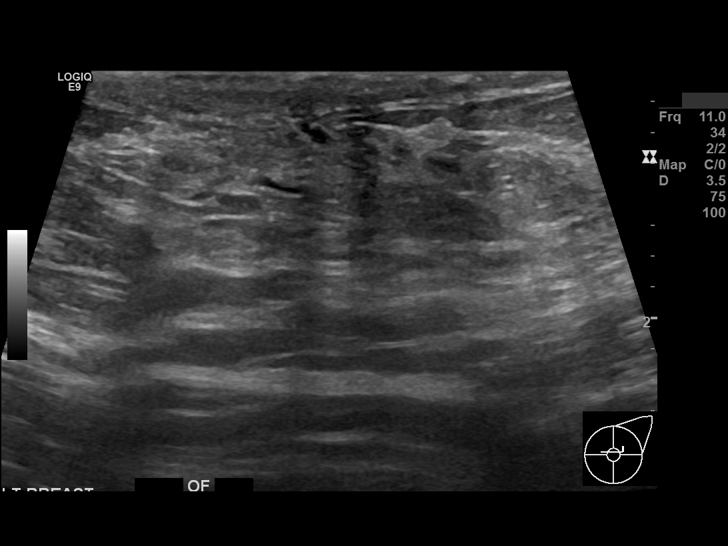
[im 24/24]
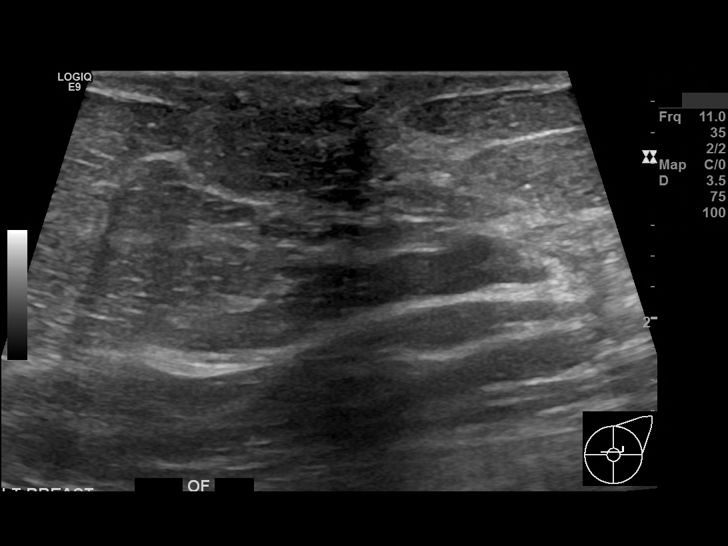

[14 of 24 positions shown; findings below may reference images not displayed]

IMPRESSION: There is no sonographic evidence of malignancy.

Screening mammogram recommended in 1 year.

BI-RADS Category 1: Negative

## 2019-11-20 IMAGING — US US SOFT TISSUE NECK/THYROID
1 series · 14 of 25 positions shown · non-contrast
Comparison: October 04, 2017.

REASON FOR EXAM: Thyroid nodule.

[Series 1: us soft tissue neck/thyroid · 51 acquisitions, 14 frames shown]
[im 1/51]
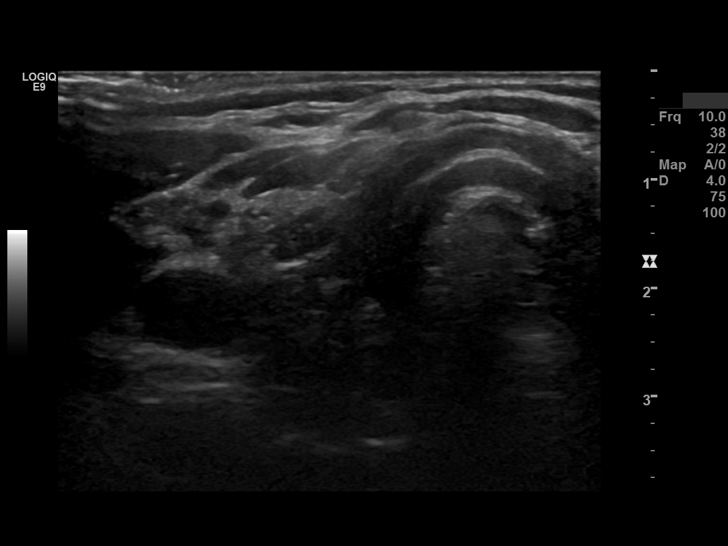
[im 5/51]
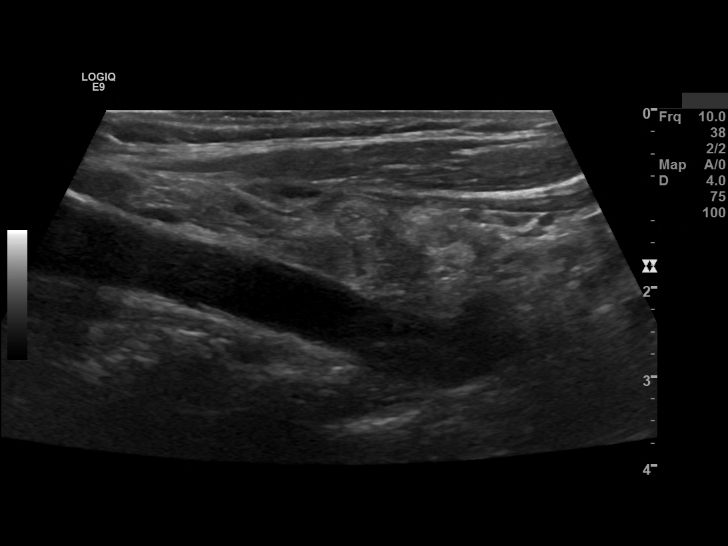
[im 9/51]
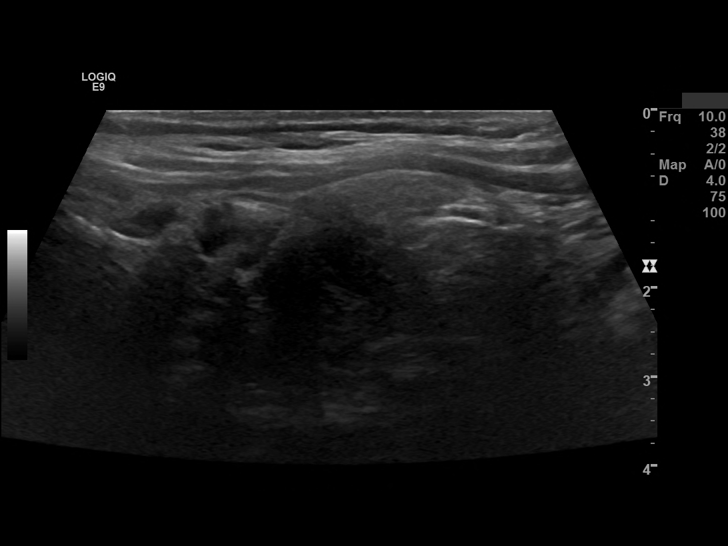
[im 13/51]
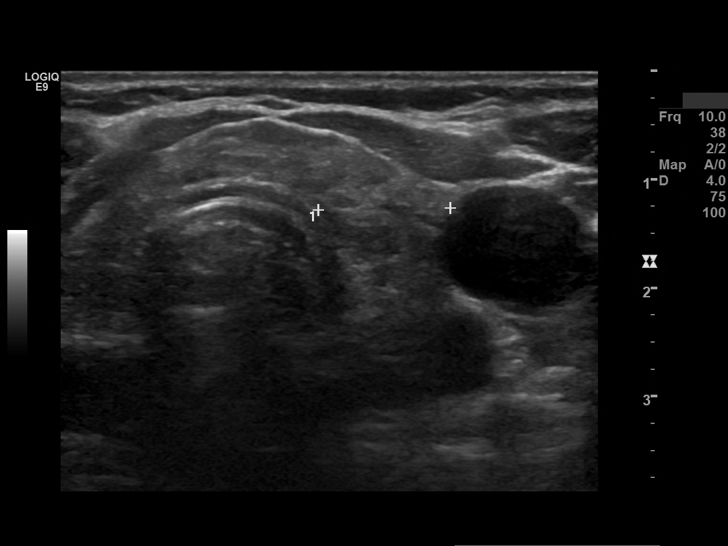
[im 17/51]
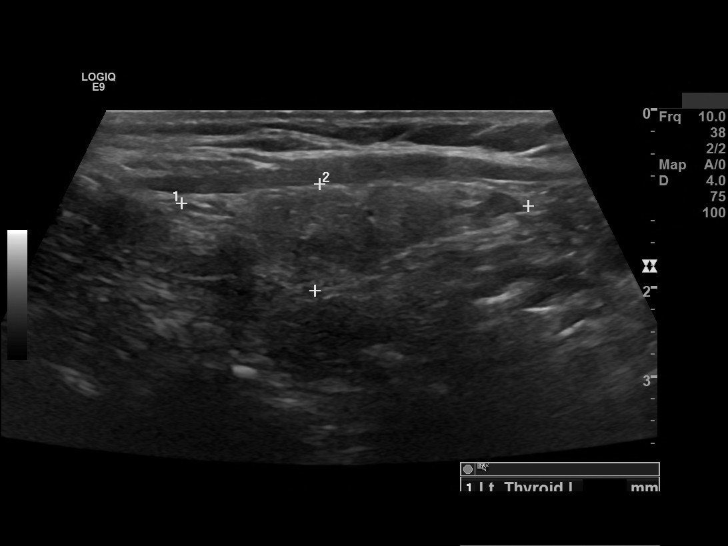
[im 19/51]
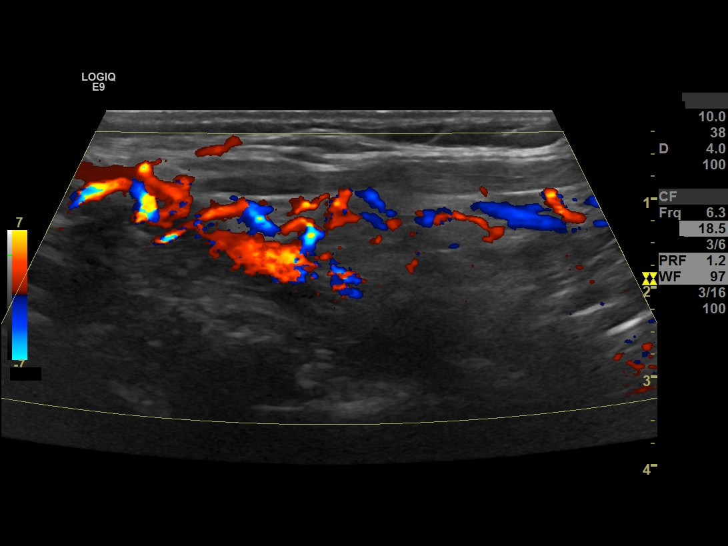
[im 23/51]
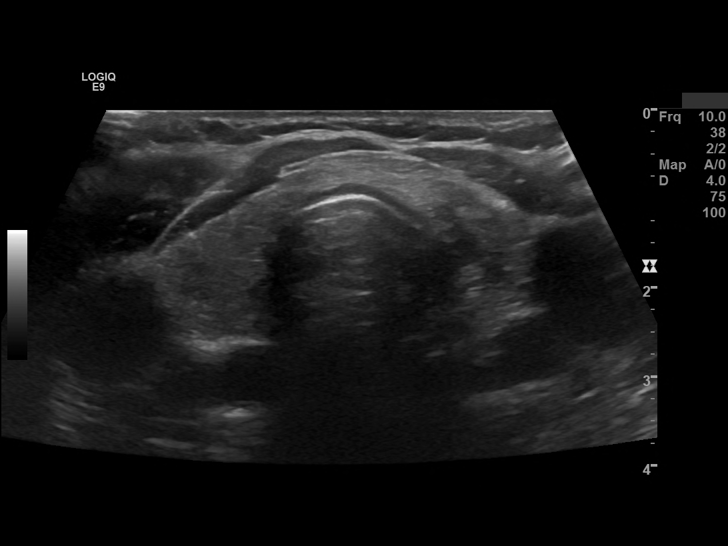
[im 28/51]
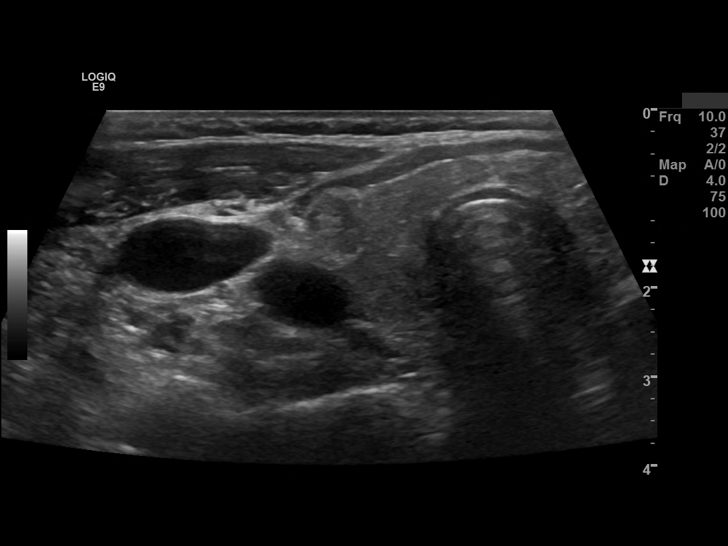
[im 32/51]
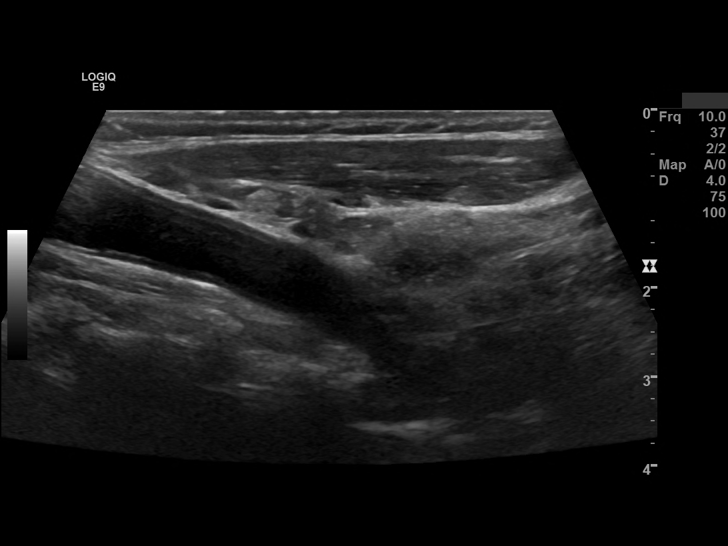
[im 34/51]
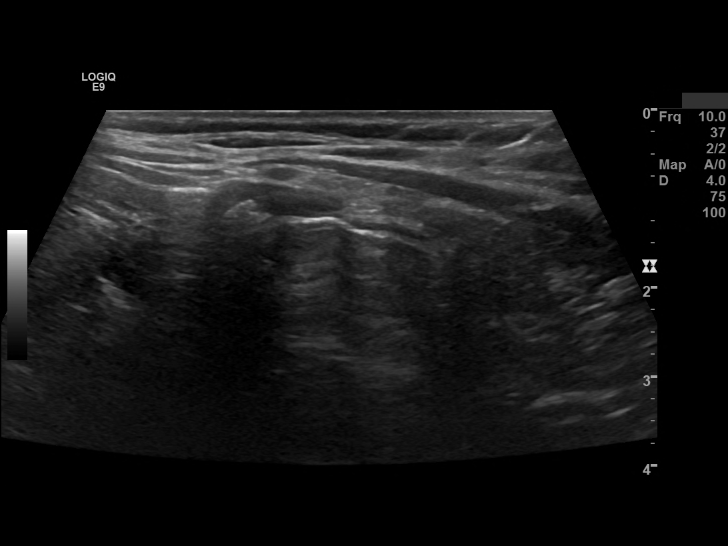
[im 38/51]
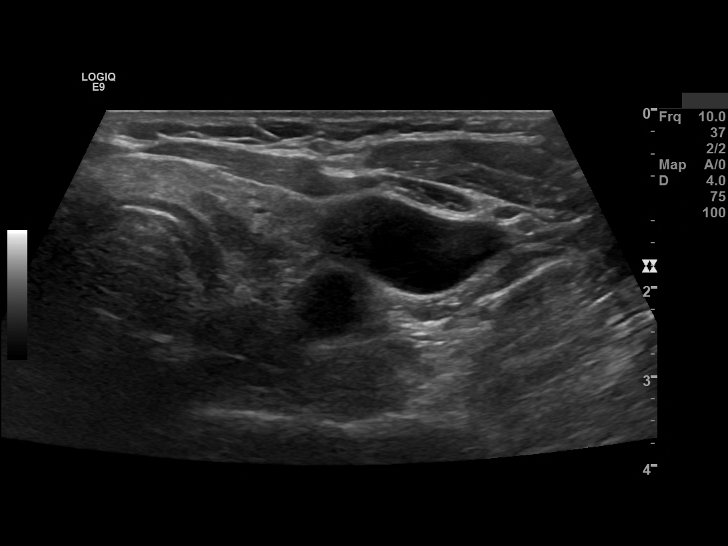
[im 42/51]
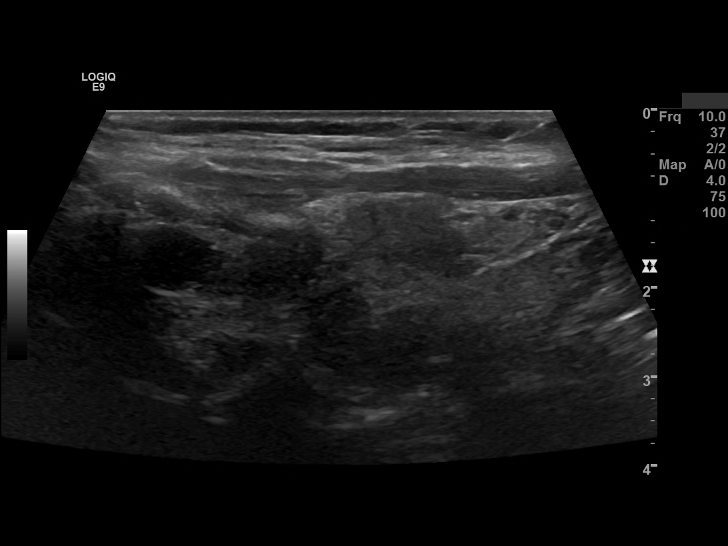
[im 46/51]
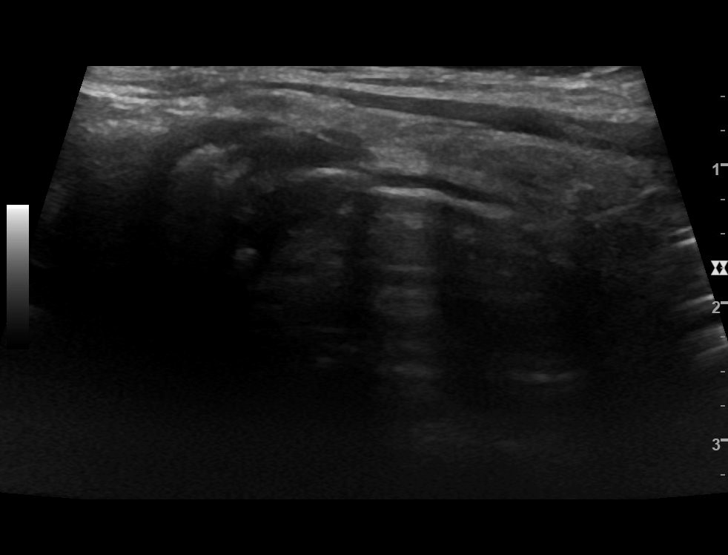
[im 51/51]
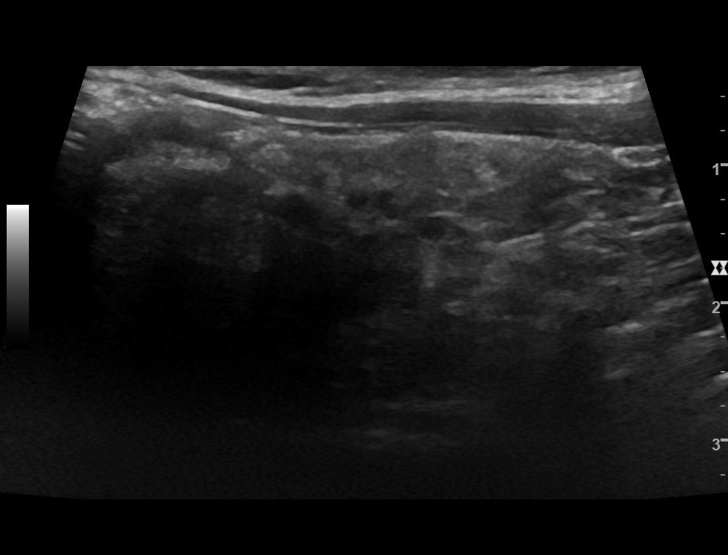

[14 of 25 positions shown; findings below may reference images not displayed]

TECHNIQUE AND FINDINGS: Multiple ultrasound images of the thyroid gland were obtained. 

The right lobe of the thyroid measures 3.9 x 1.7 x 1.4 cm and has a mildly inhomogeneous echotexture. There is a heterogeneously mildly echogenic solid nodule measuring 7 x 8 mm noted.

The thyroid isthmus measures 4 mm in thickness.

The left lobe of the thyroid measures 3.9 x 1.2 x 1.2 cm and has a mildly inhomogeneous echotexture. No focal lesions are seen..
IMPRESSION: Mildly suspicious TR 3 nodule in the right lobe of the thyroid. Annual follow-up recommended.

ACR TI-RADS recommendations:

TR5 (>= 7 points) - FNA if > 1 cm, follow up if 0.5-0.9 cm every year for 5 years

TR4 (4-6 points) - FNA if >=1.5 cm, follow up if 1-1.4 cm in 1, 2, 3, and 5 years

TR3 (3 points) - FNA if 2.5 cm, follow up if 1.5-2.4 cm in 1, 3, and 5 years

TR2 (2 points) & TR1 (0 points) - No FNA or follow up.

[place], Endhi Kristian, et al. ACR Thyroid Imaging, Reporting and Data System (TI-RADS): White Paper of the ACR TI-RADS Committee. J Am Coll Radiology 3037, 14(5) 587-595.

## 2019-11-20 IMAGING — CR KNEE LT 4 VWS MIN
1 series · 4 of 4 positions shown · non-contrast
Comparison: None.

HISTORY: Knee pain.
TECHNIQUE: Left knee, 4 views.

[Series 1: ap · 0.17mm/px · 4 of 4 slices shown]
[im 1/4]
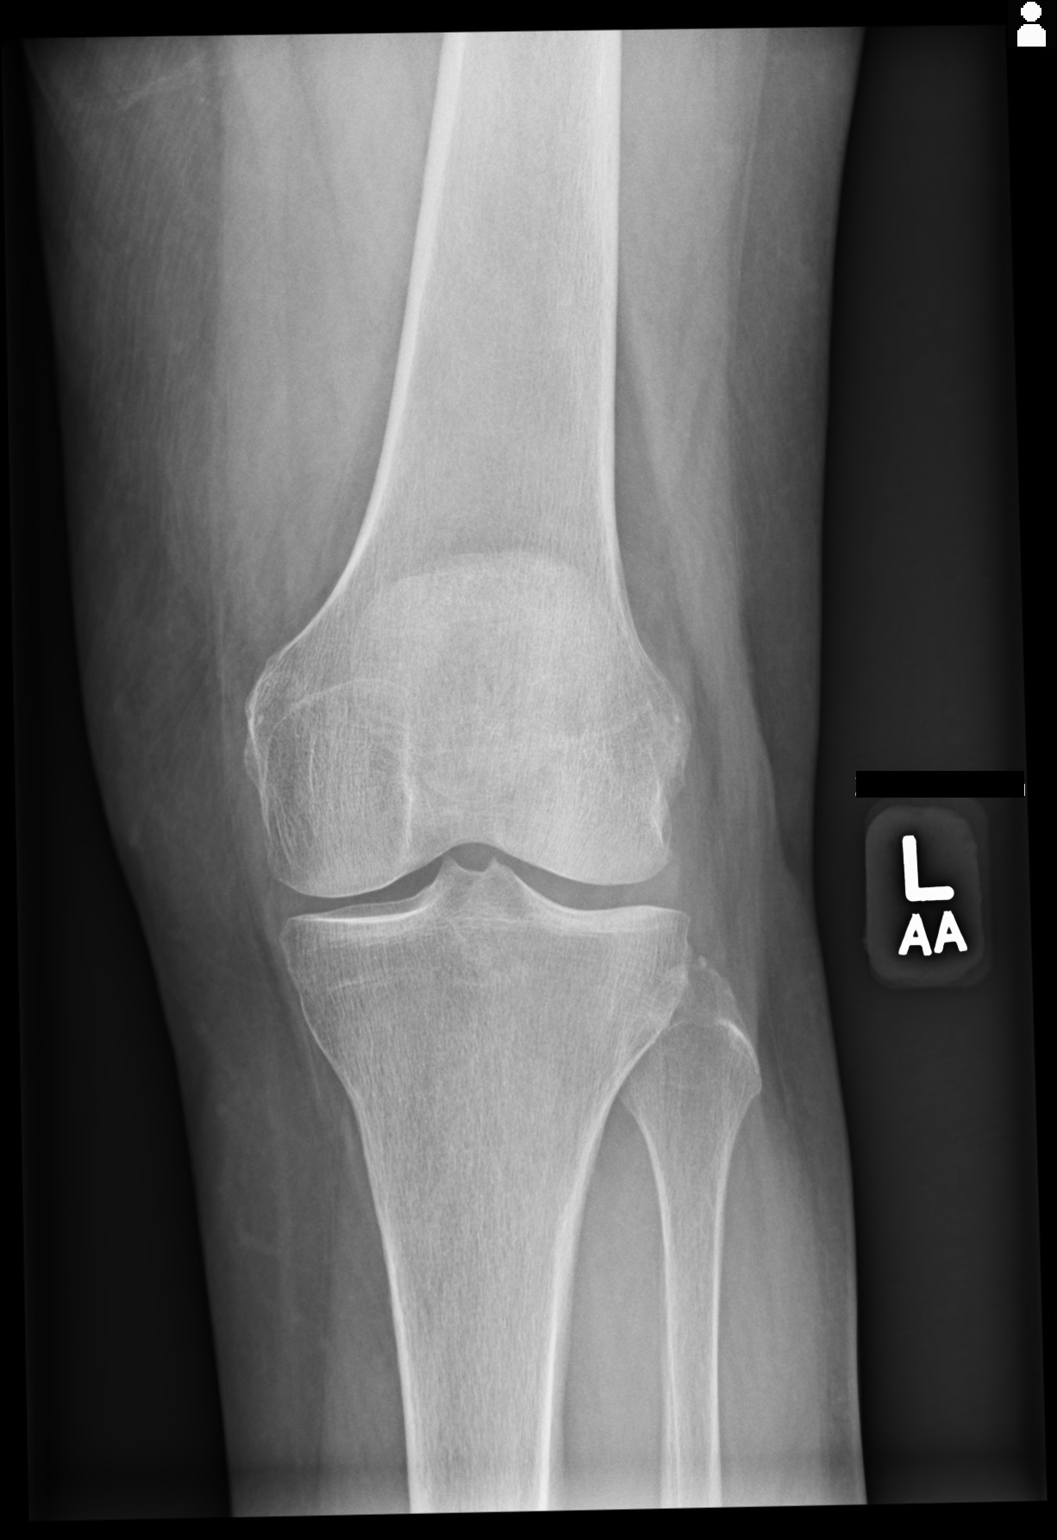
[im 2/4]
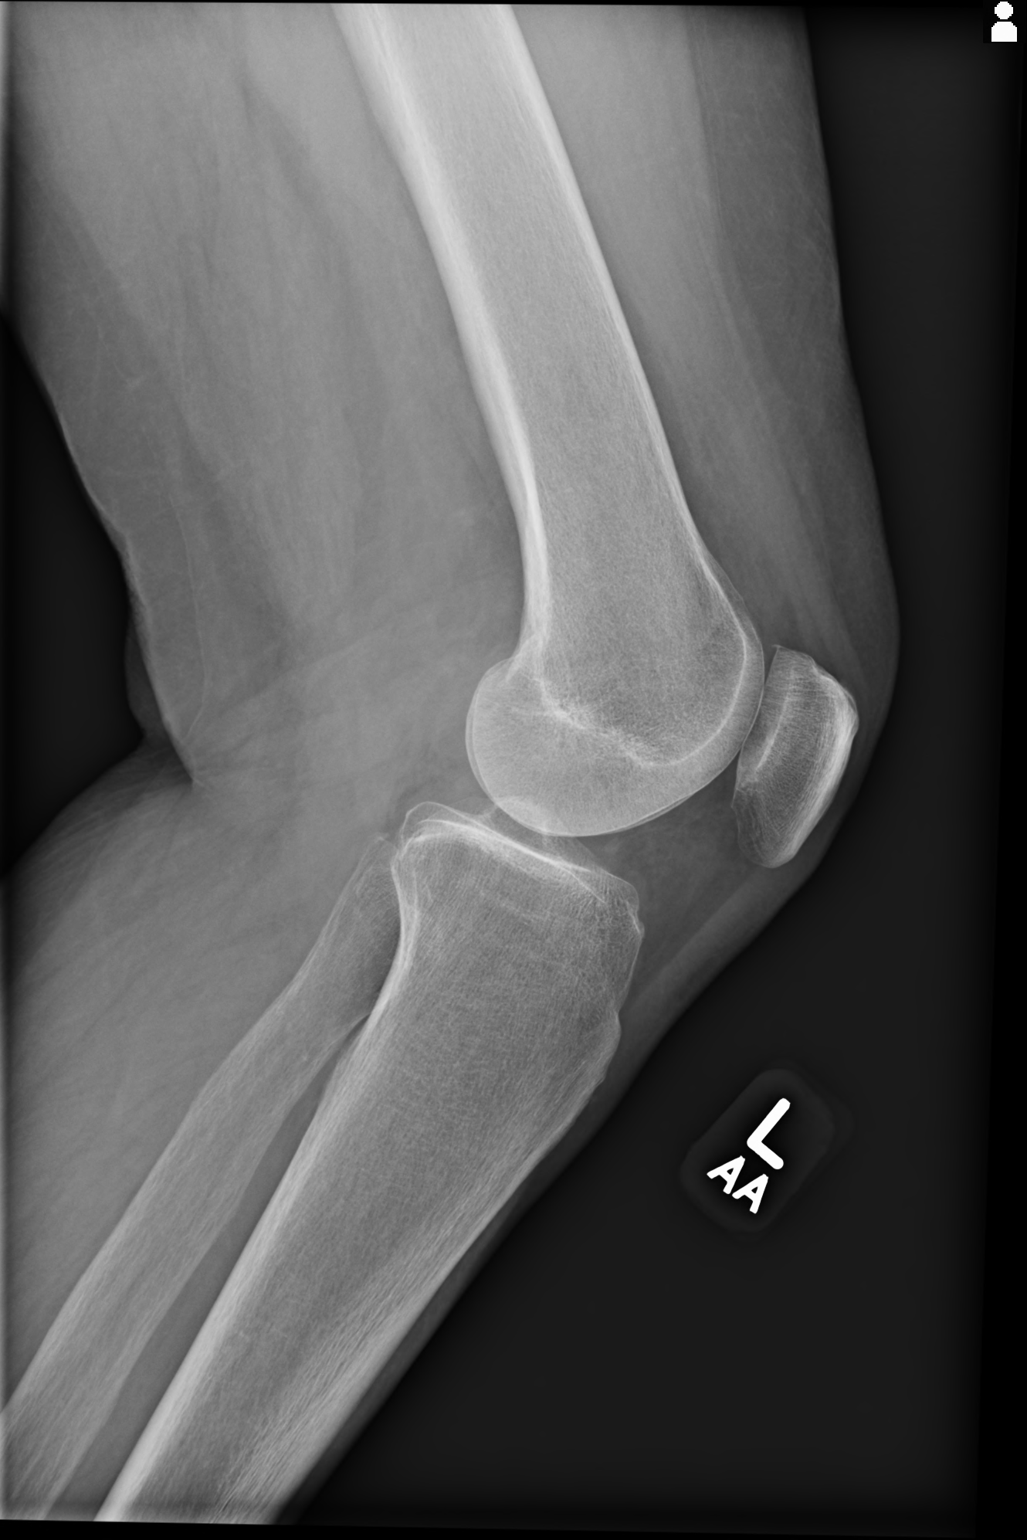
[im 3/4]
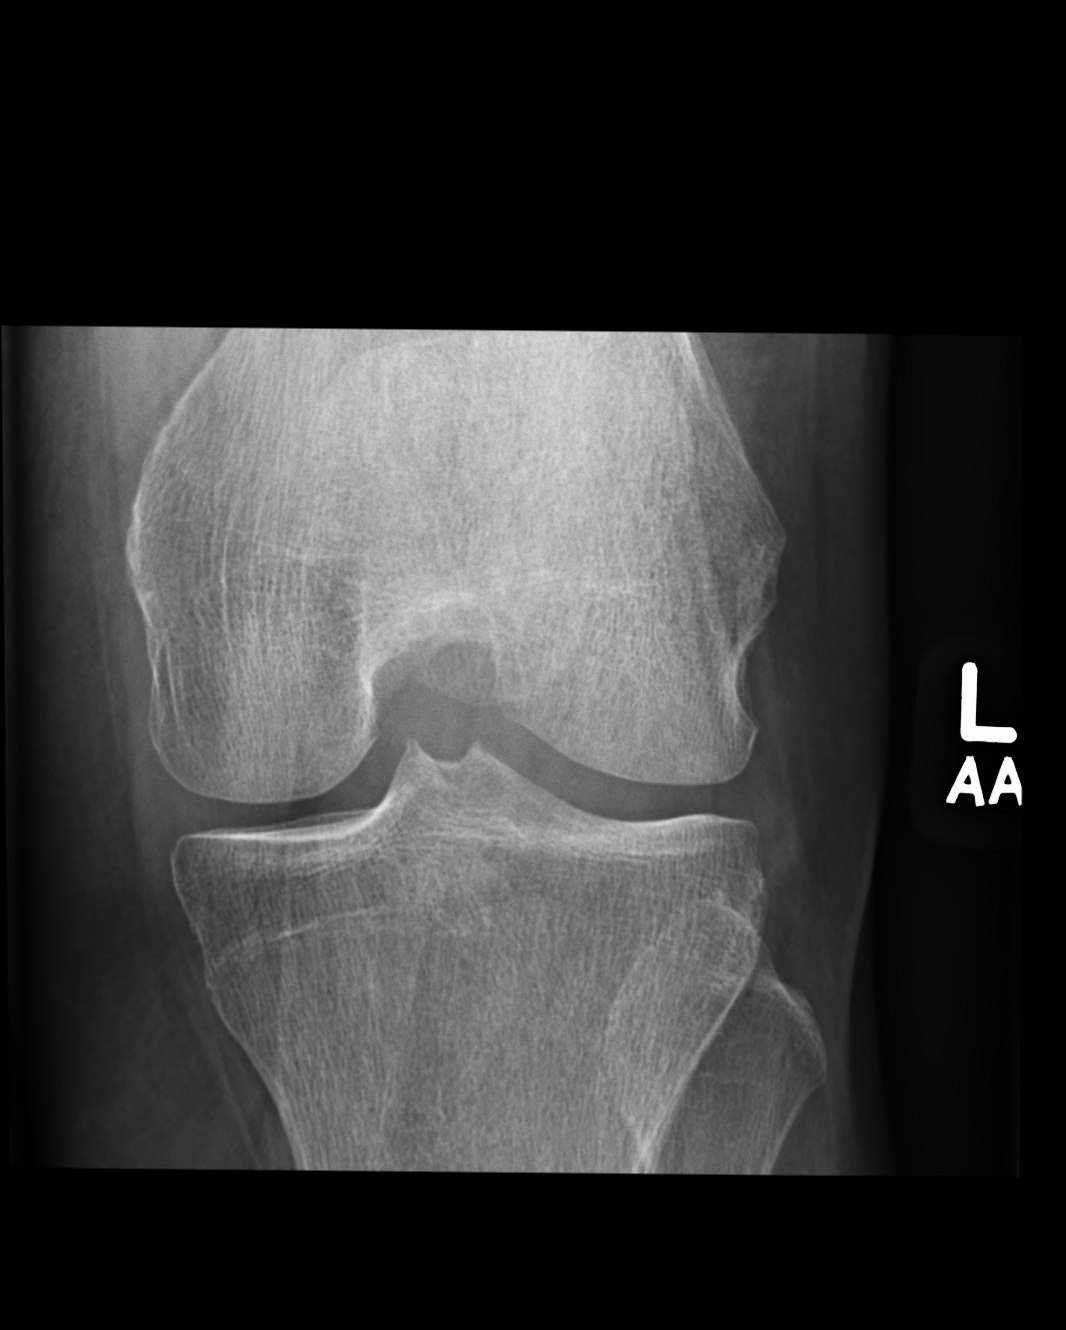
[im 4/4]
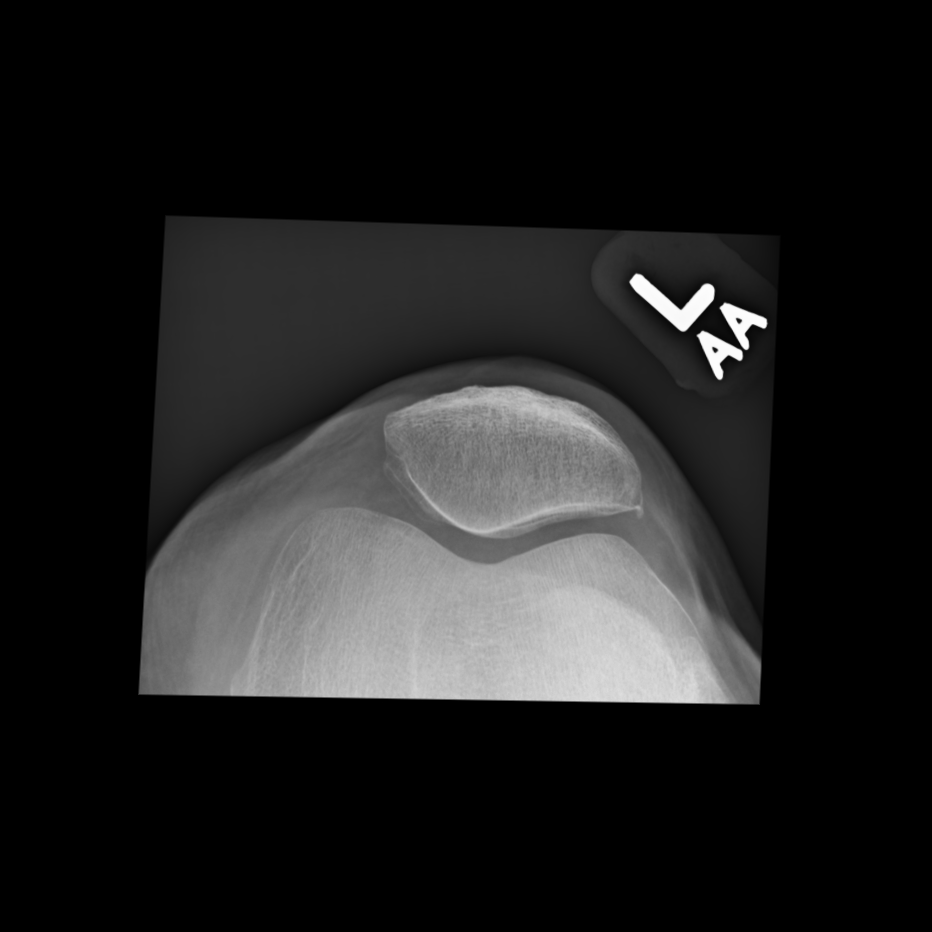

[4 of 4 positions shown; findings below may reference images not displayed]

FINDINGS: Four views of the left knee demonstrate normal osseous mineralization. No fractures. No destructive lesions. Joint spaces appear adequately maintained. No significant effusion. Soft tissues unremarkable.
IMPRESSION: Normal left knee.

## 2019-11-20 IMAGING — CR KNEE RT 4 VWS MIN
1 series · 4 of 4 positions shown · non-contrast
Comparison: None.

HISTORY: Knee pain.
TECHNIQUE: Right knee, 4 views.

[Series 1: ap · 0.17mm/px · 4 of 4 slices shown]
[im 1/4]
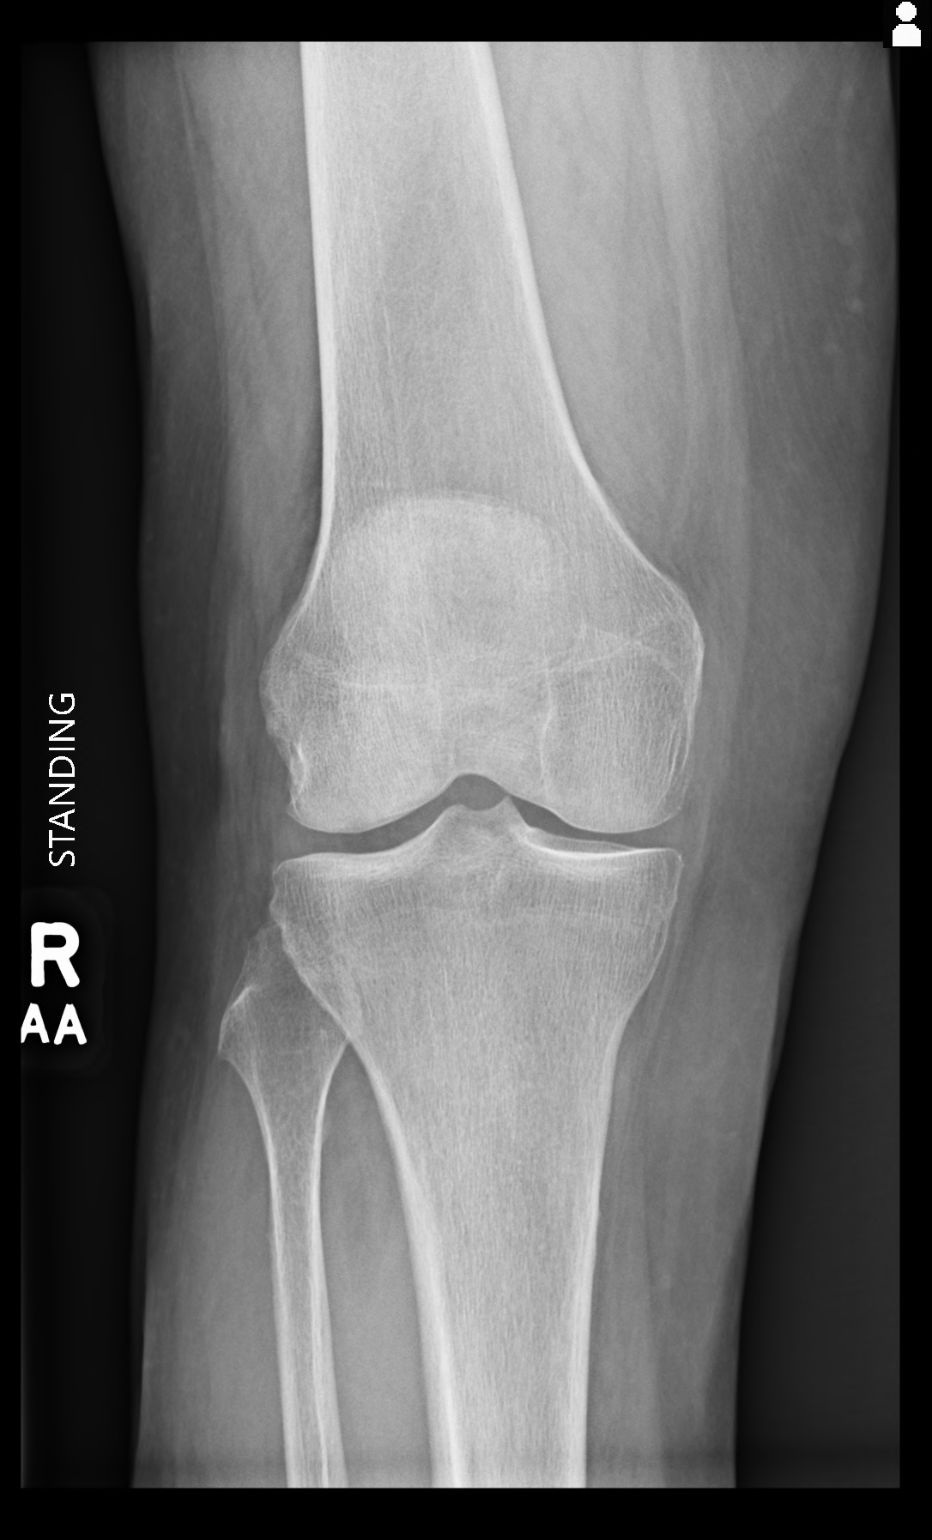
[im 2/4]
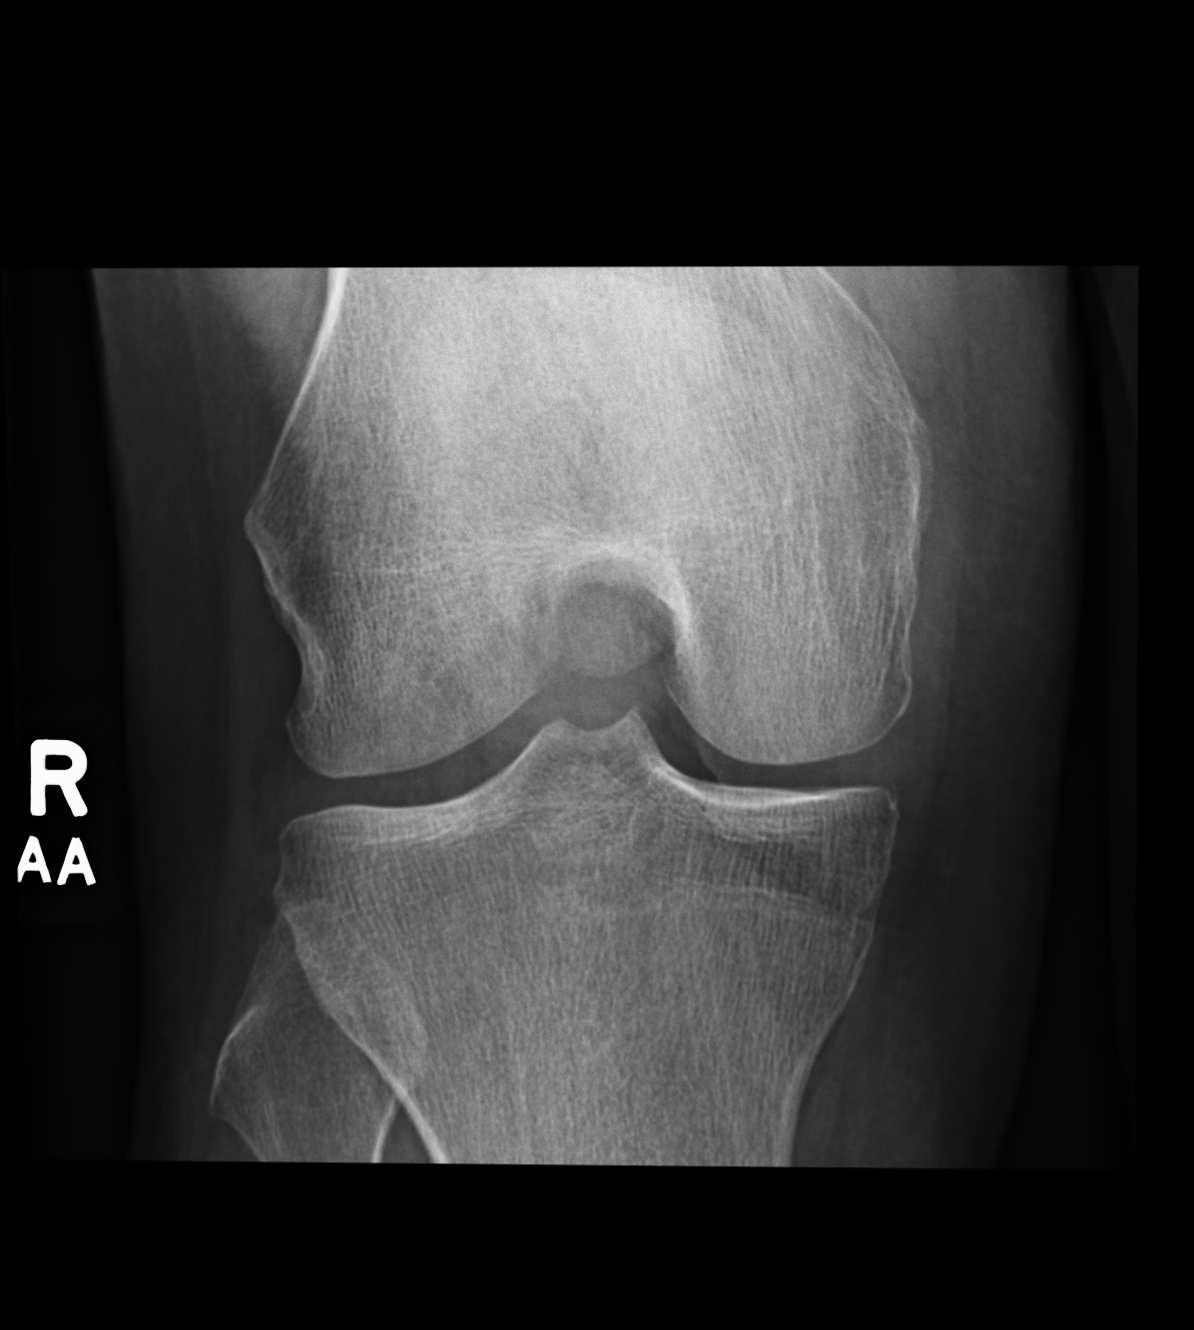
[im 3/4]
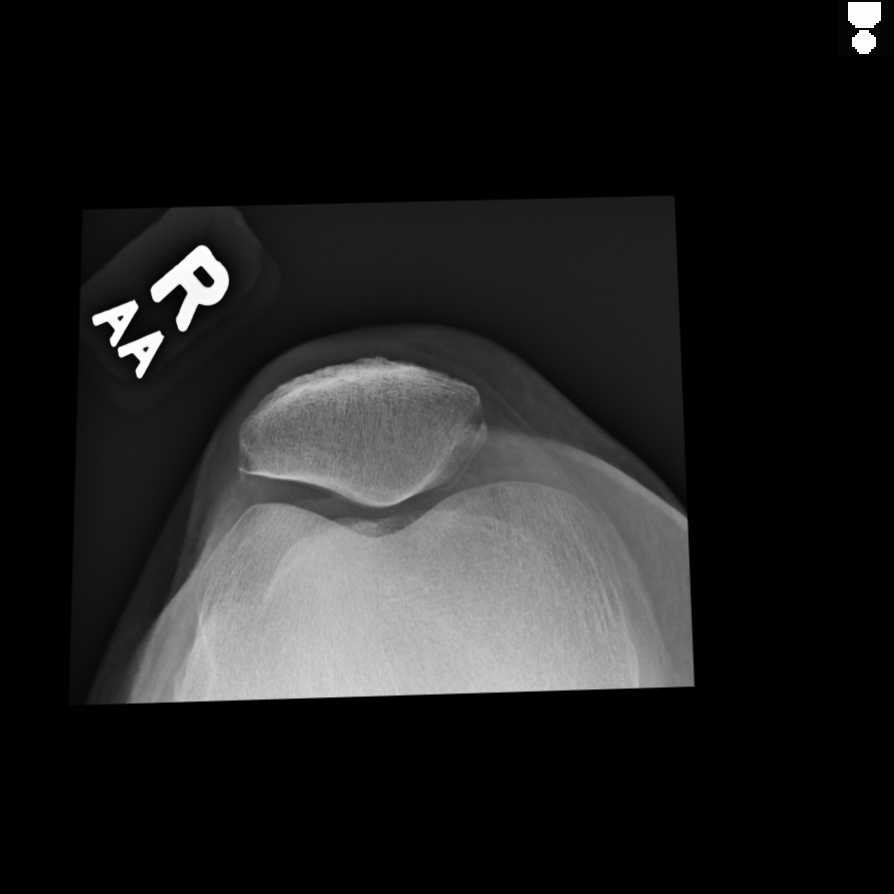
[im 4/4]
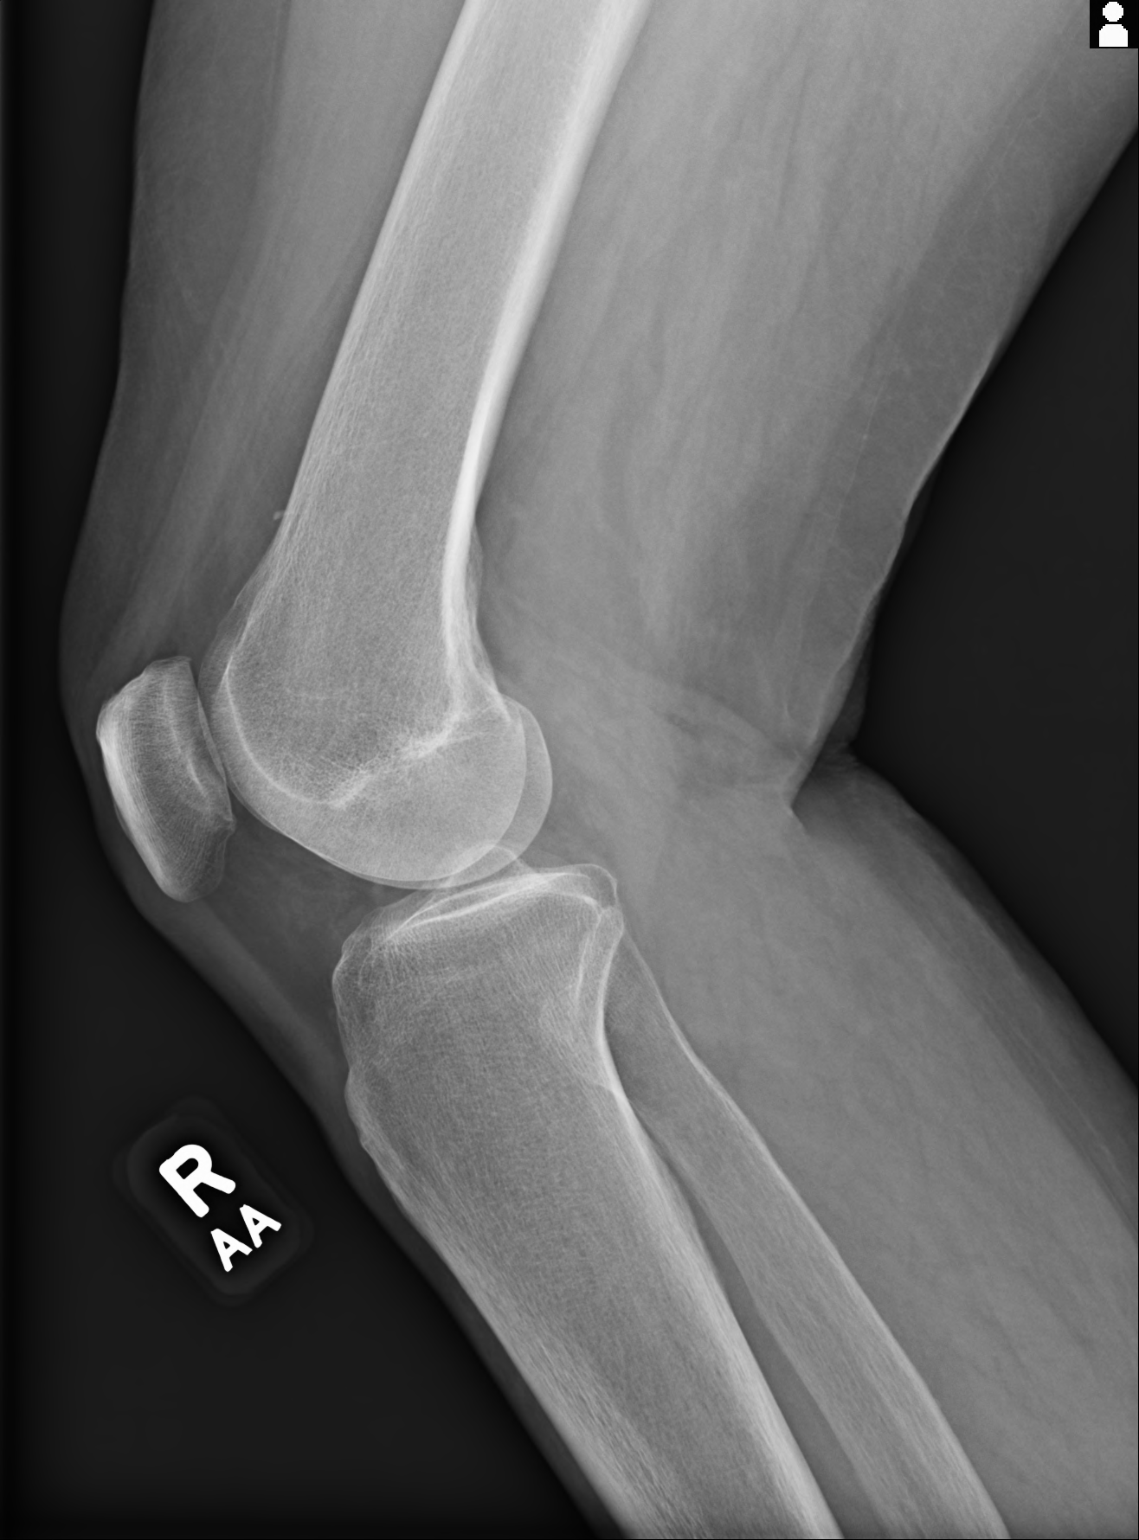

[4 of 4 positions shown; findings below may reference images not displayed]

FINDINGS: Four views of the right knee demonstrate normal osseous mineralization. No fractures. No destructive lesions. Joint spaces appear adequately maintained. No effusion. Soft tissues unremarkable.
IMPRESSION: Normal right knee.

## 2019-11-22 IMAGING — US DOPPLER RENAL ARTERY
1 series · 13 of 25 positions shown · non-contrast
Comparison: Renal ultrasound with Doppler 05/03/2018.

HISTORY: Renal insufficiency, 73-year-old female.
TECHNIQUE: Renal ultrasound and renal artery Doppler with color Doppler.

[Series 1: doppler renal artery · 13 of 45 slices shown]
[im 1/45]
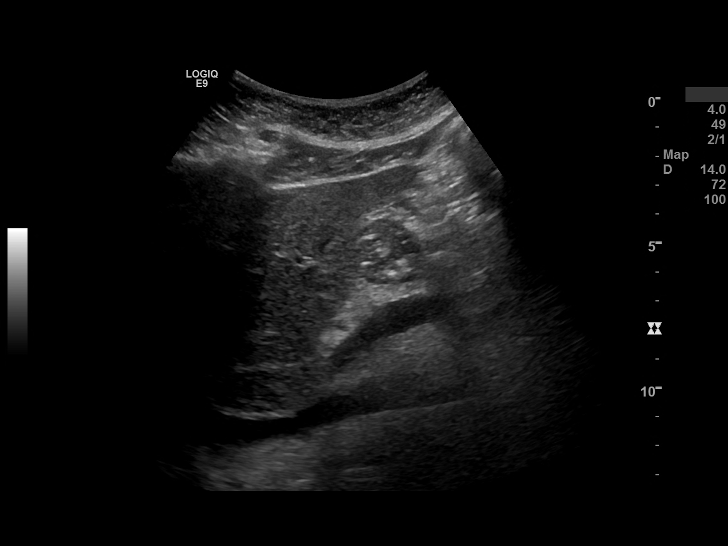
[im 4/45]
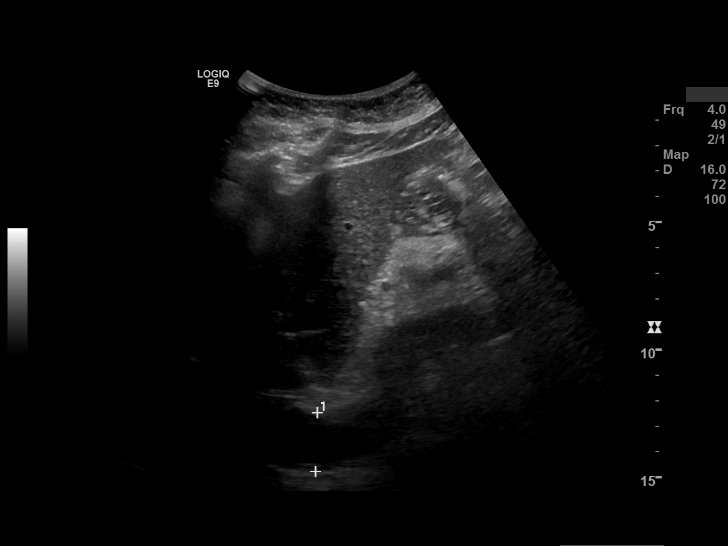
[im 8/45]
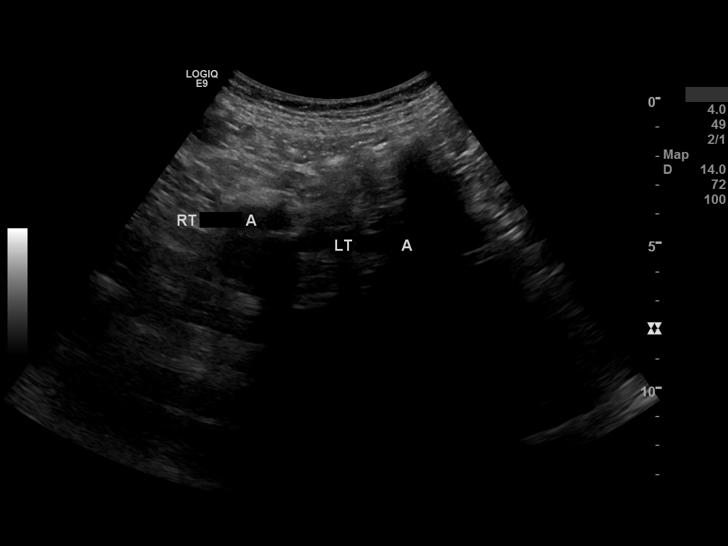
[im 12/45]
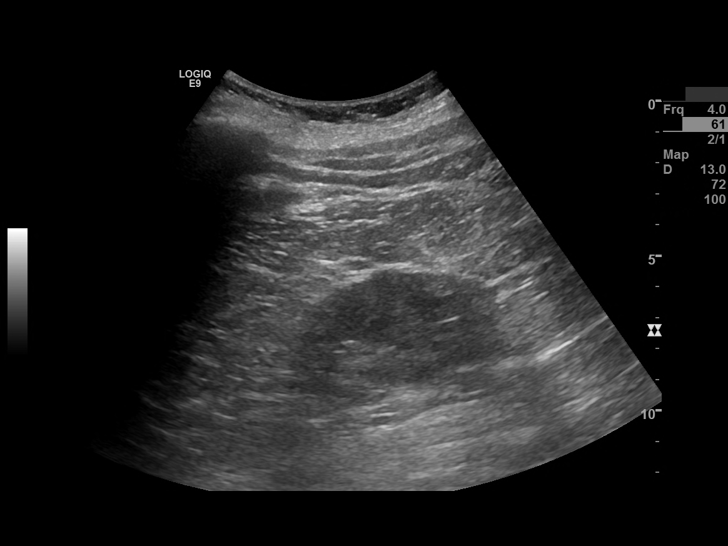
[im 15/45]
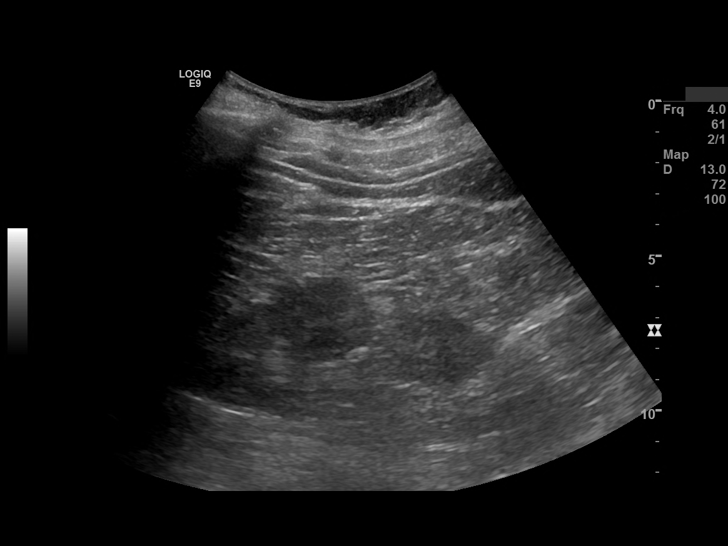
[im 19/45]
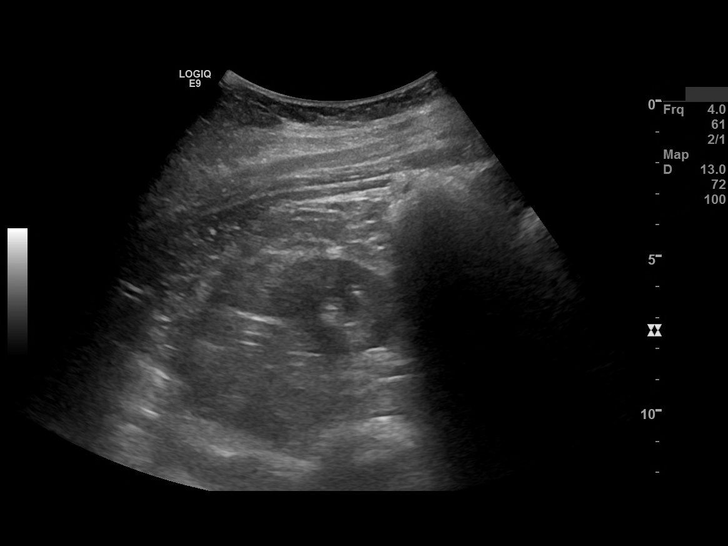
[im 23/45]
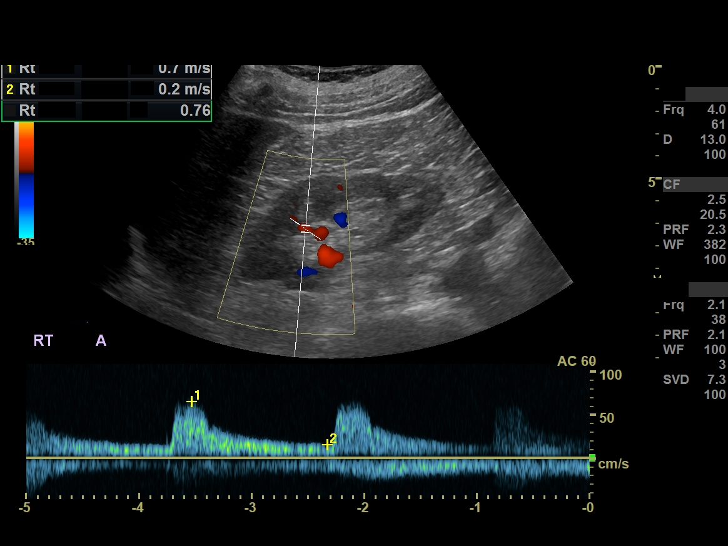
[im 26/45]
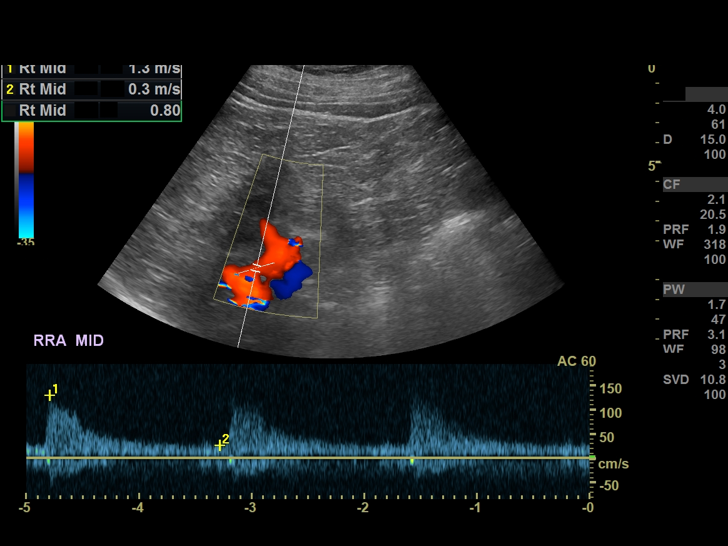
[im 30/45]
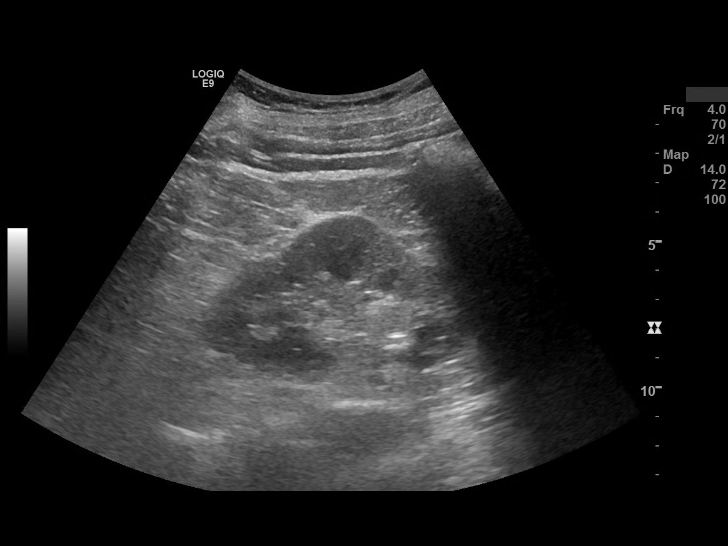
[im 34/45]
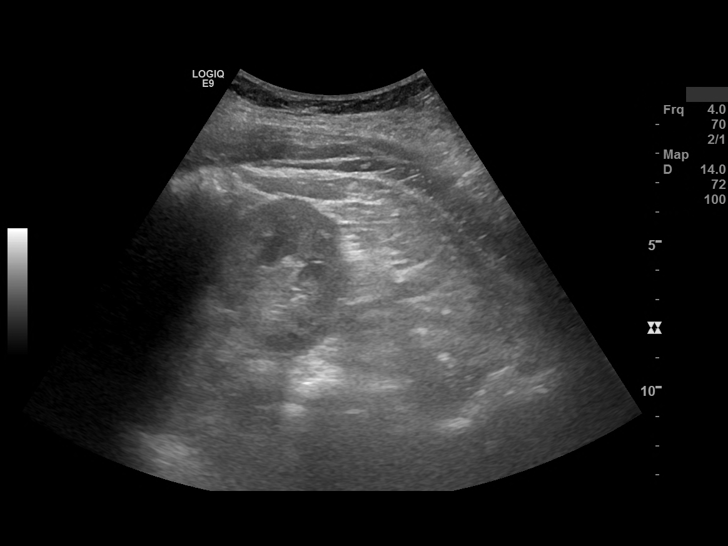
[im 37/45]
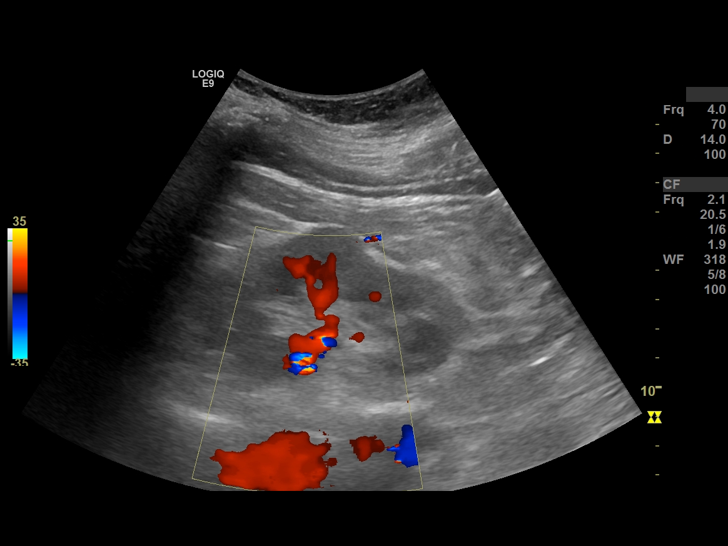
[im 41/45]
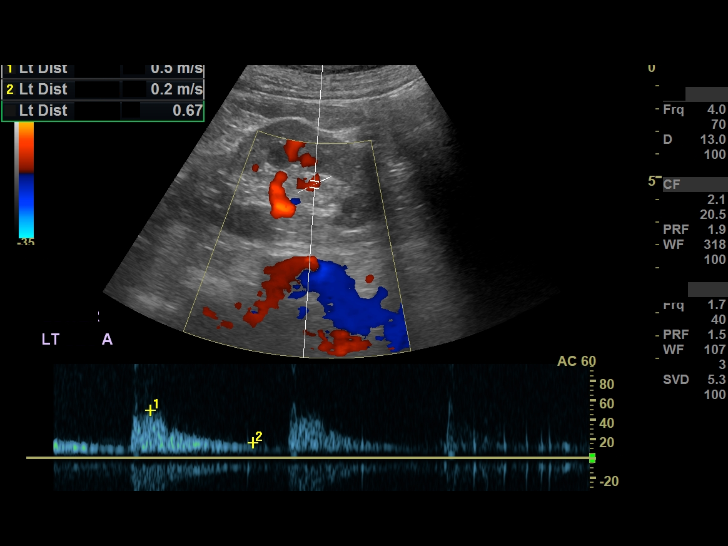
[im 45/45]
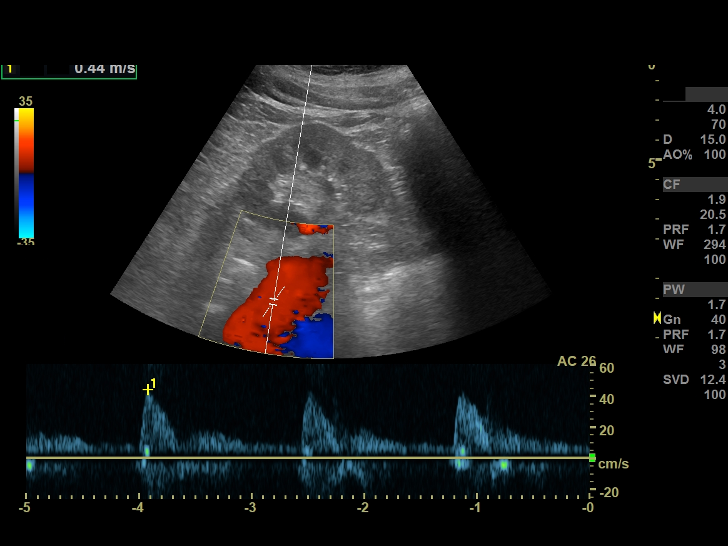

[13 of 25 positions shown; findings below may reference images not displayed]

FINDINGS: Right kidney 101 x 49 x 50 mm. Cortex 15 mm. No hydronephrosis.

Left kidney 100 x 55 x 51 mm. Cortex 14 mm. No hydronephrosis.

Urinary bladder shows no mass or stone.

Abdominal aorta, IVC, origin common iliac arteries are patent.

RESISTIVE INDEX 

RIGHT RENAL ARTERY

Prox (Origin)

Mid

Distal (Hilum)

Segmental upper pole

Segmental lower pole

Arcuate

Renal Venous Flow At [place]:   Yes

LEFT RENAL ARTERY

Prox (Origin)

Mid

Distal ([place]ilum)

Segmental upper pole

Segmental lower pole

Arcuate

Renal Venous Flow At [place]:   Yes

RENAL ARTERY ORIGIN

Suprarenal AO-PSV       0.44 m/s

Rt. Renal Art  PSV         1.20 m/s

Rt Renal to Aorta Ratio  2.7 %

Lt Renal Art  -  PSV        1.40 m/s

Lt Renal to Aorta Ratio   3.2 %

A ratio of 3.5 or greater indicates a greater than 60% stenosis

DISTAL RENAL ORIGIN

Suprarenal AO-PSV       0.44 m/s

Rt. Renal Art  PSV         0.80 m/s

Rt Renal to Aorta Ratio  1.8 %

Lt Renal Art  -  PSV        0.90 m/s

Lt Renal to Aorta Ratio   2.0%

A ratio of 3.5 or greater indicates a greater than 60% stenosis

Cardiac rhythm is regular.
IMPRESSION: 1. Elevated resistive indices bilaterally.

2. Right and left kidney renal arteries at origins show elevated renal to aortic ratios, not quite 60% but borderline significant stenoses.

3. No hydronephrosis seen. Nothing acute in urinary bladder.

## 2019-11-22 IMAGING — US US ABDOMEN COMPLETE
1 series · 13 of 25 positions shown · non-contrast
Comparison: Previous renal Doppler ultrasound May 03, 2018

INDICATION: 73 year-old female with renal insufficiency.
TECHNIQUE: Using real-time and Doppler ultrasound, the abdomen was evaluated.

[Series 2: us abdomen complete · 13 of 93 slices shown]
[im 1/93]
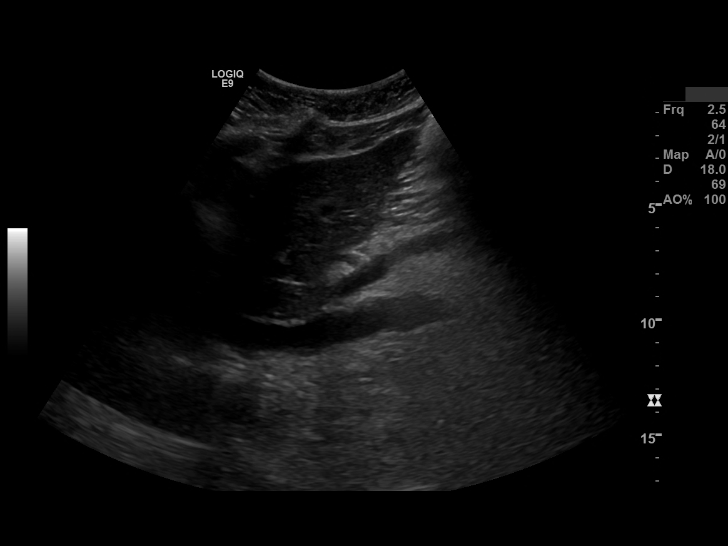
[im 8/93]
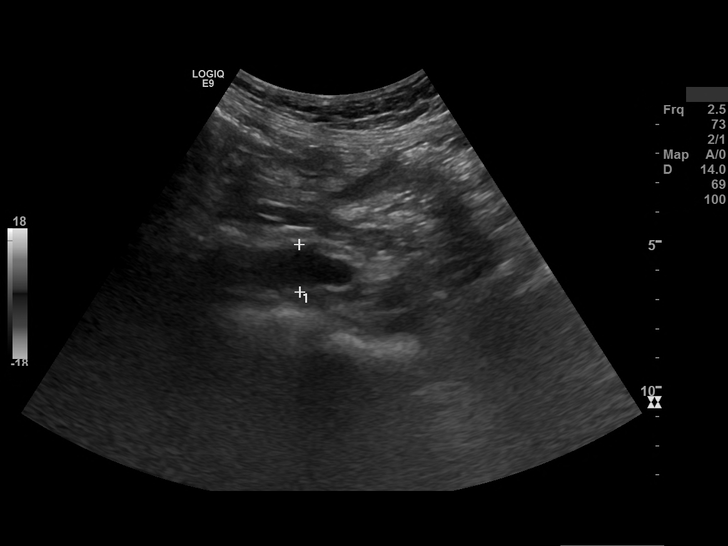
[im 16/93]
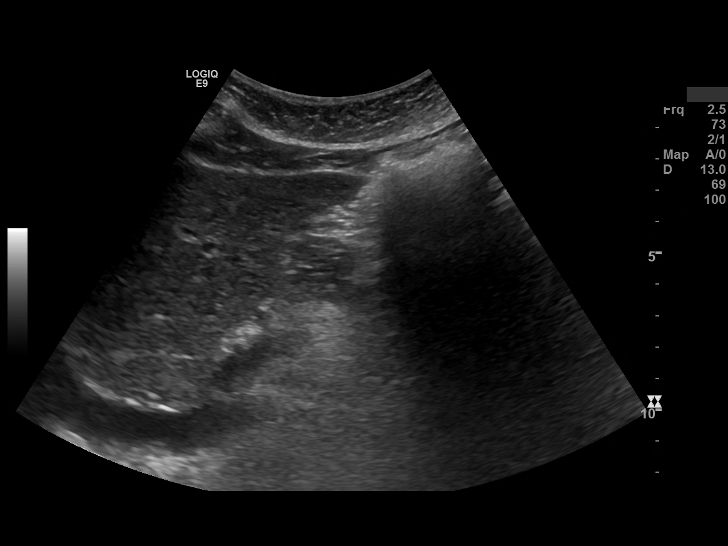
[im 24/93]
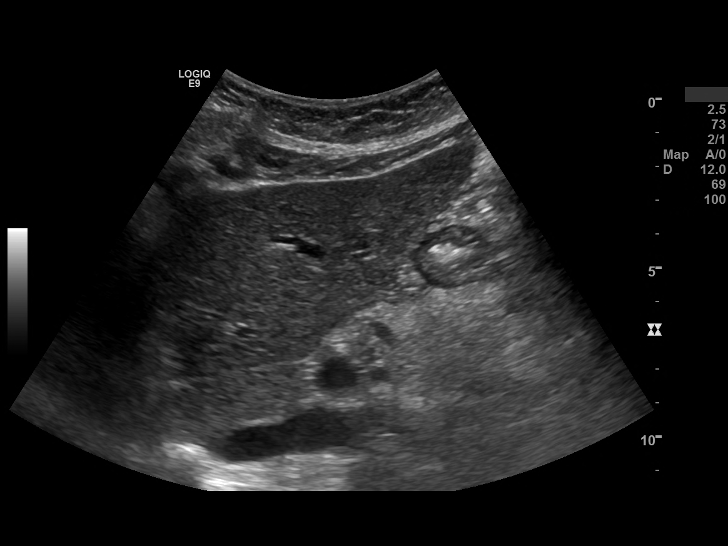
[im 31/93]
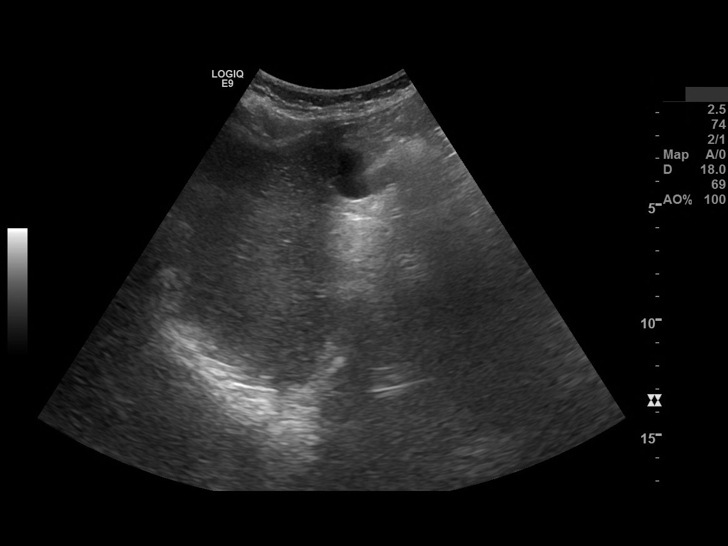
[im 39/93]
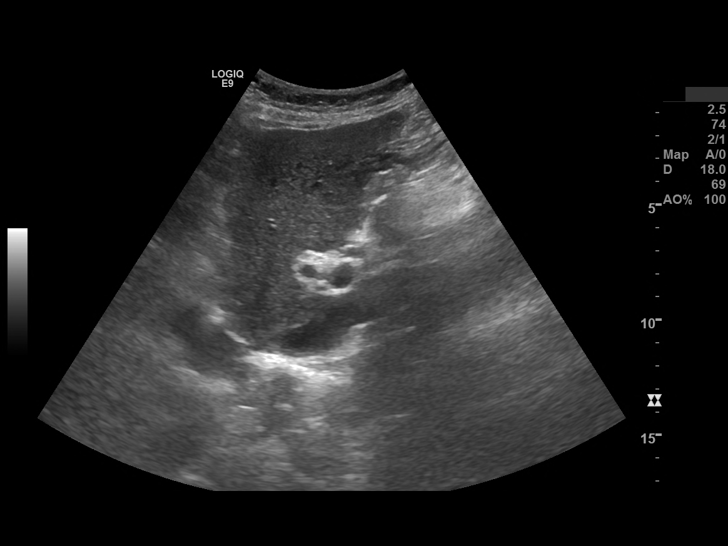
[im 47/93]
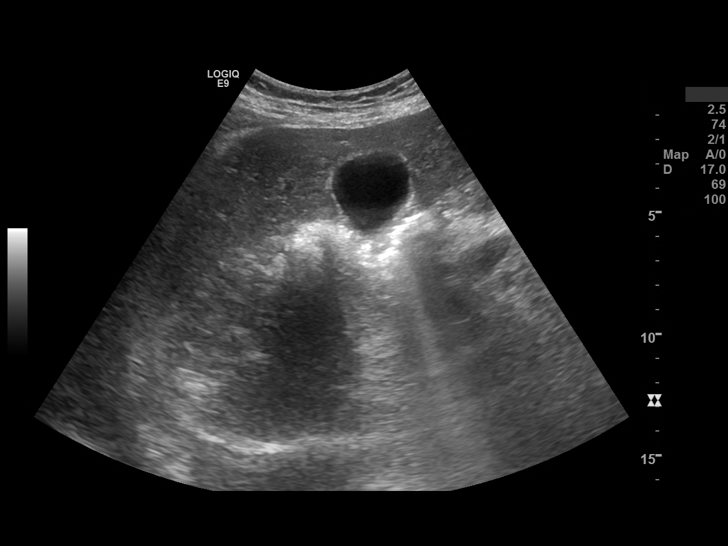
[im 54/93]
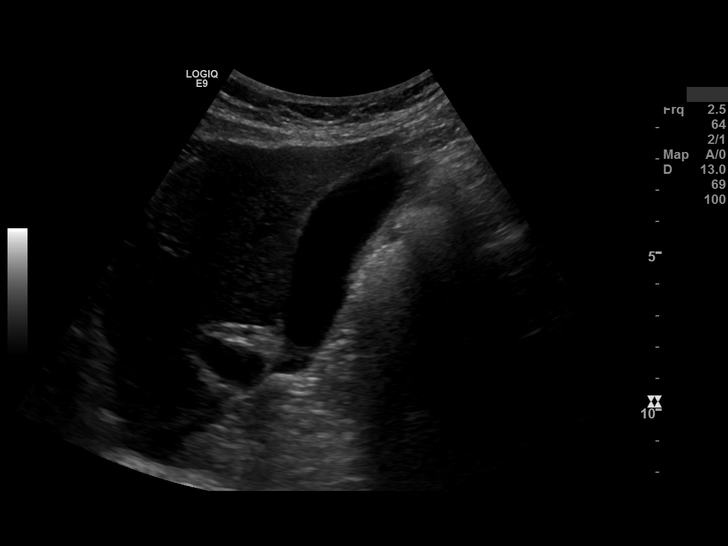
[im 62/93]
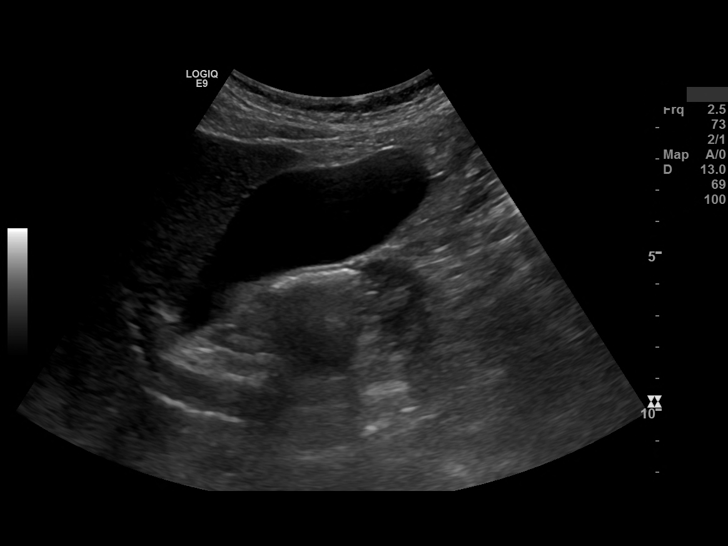
[im 70/93]
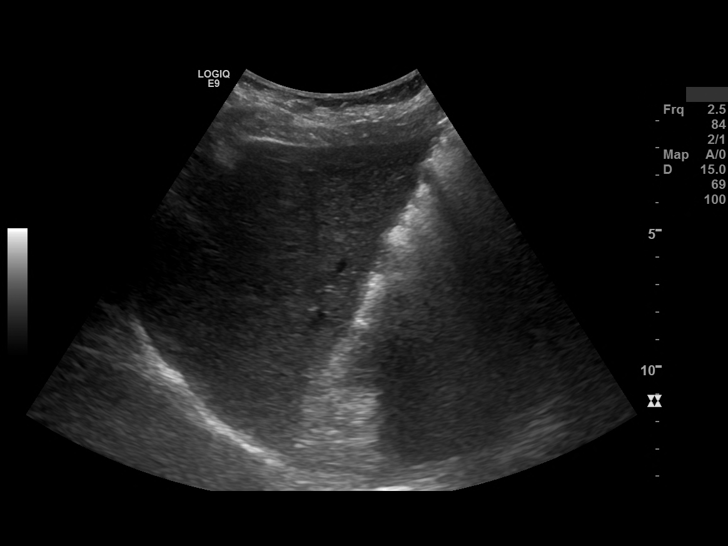
[im 77/93]
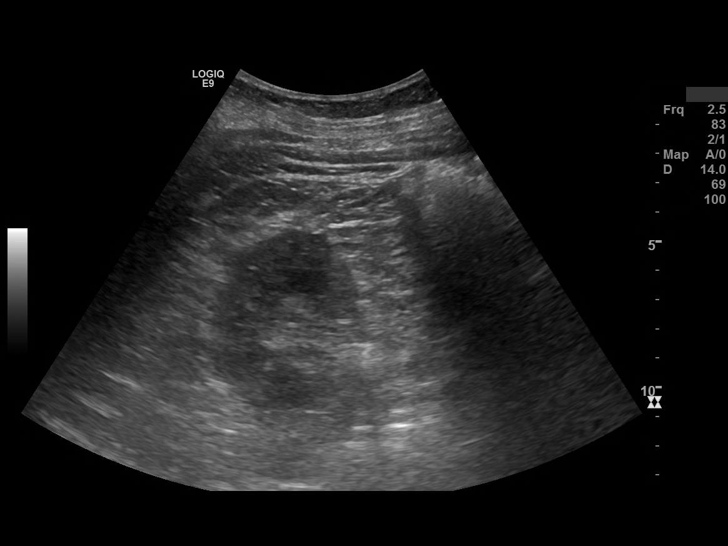
[im 85/93]
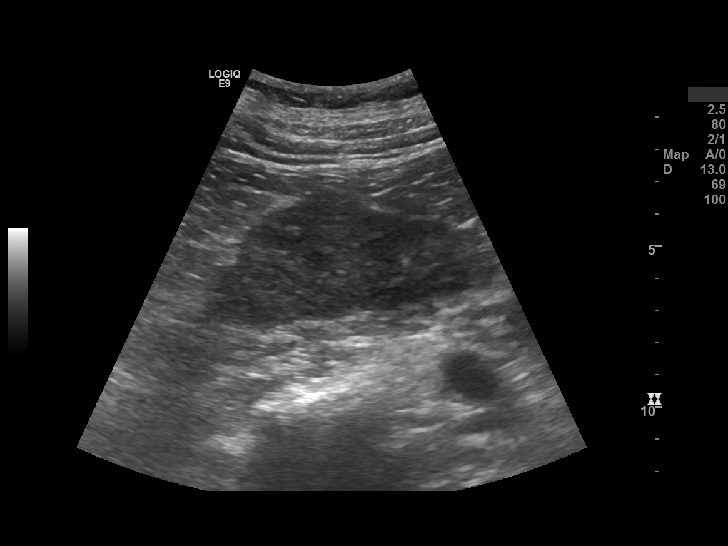
[im 93/93]
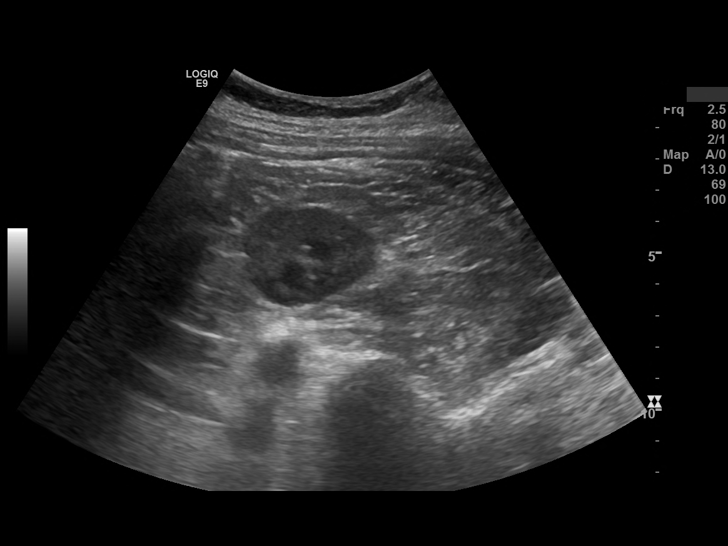

[13 of 25 positions shown; findings below may reference images not displayed]

FINDINGS: The visualized portion of the pancreas is unremarkable. The visualized portion of the abdominal aorta and inferior vena cava is unremarkable.

Liver: Length of  130 mm in the right midclavicular line. The liver echotexture is within normal limits. Hepatopetal flow is identified in the portal vein. No focal masses identified. No intrahepatic biliary ductal dilatation is seen. 

Gallbladder: The gallbladder is within normal limits. The gallbladder wall thickness measures 1.2 mm. There is a  negative sonographic Murphy's sign. The common bile duct measures 5.63  mm.

Spleen measures 85 x 37 x 47 mm and demonstrates normal echotexture without focal lesions. 

Right kidney measures 98 x 51 x 63 mm. The right kidney volume measures 162.4 ml.

Left kidney measures 97 x 52 x 49 mm. The left kidney volume measures 131.9 ml.

Kidneys are sonographically normal in appearance.  No focal mass, hydronephrosis, nephrolithiasis or cortical thinning is identified.

No ascites is seen.
IMPRESSION: Negative abdominal ultrasound.

## 2020-01-03 IMAGING — MR MRA ABDOMEN WO/W CONTRAST
7 series · 40 of 40 positions shown · IV contrast (20cc prohance)
Comparison: None.

REASON FOR EXAM: Renal insufficiency.

[Series 2: t2_haste_coronal · coronal · 6.0mm · 1.25mm/px · 3 of 30 slices shown]
[im 1/30]
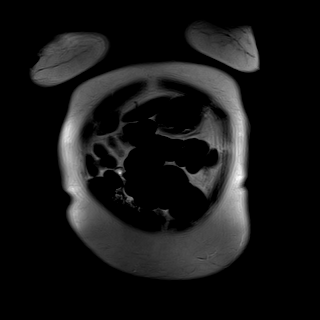
[im 15/30]
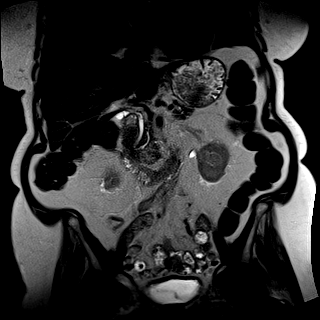
[im 30/30]
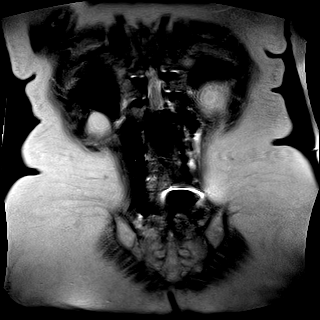

[Series 3: t1_3d_vibe_axial_(person_name)_(person_name) · axial · 3.0mm · 1.19mm/px · z∈[-79,+134]mm · 6 of 72 slices shown (1 of 2)]
[im 1/72]
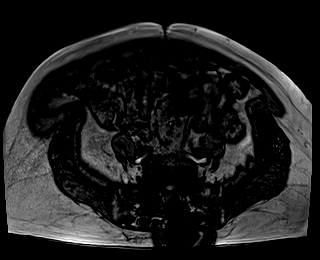
[im 15/72]
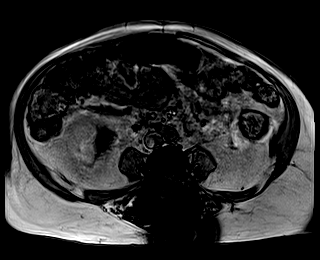
[im 29/72]
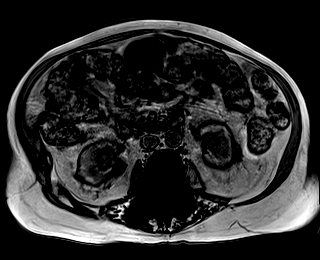
[im 43/72]
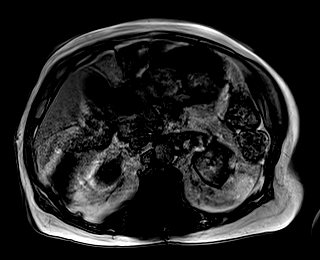
[im 57/72]
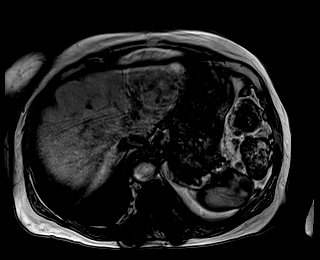
[im 72/72]
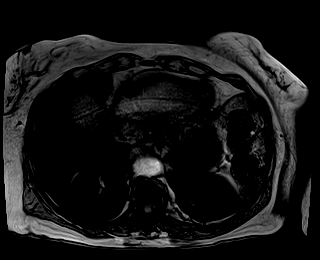

[Series 4: t1_3d_vibe_axial_(person_name)_in · axial · 3.0mm · 1.19mm/px · z∈[-79,+134]mm · 5 of 72 slices shown]
[im 1/72]
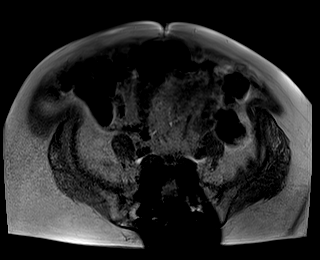
[im 18/72]
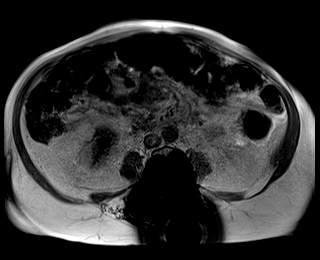
[im 36/72]
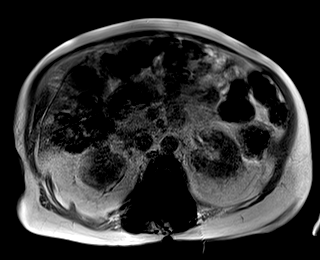
[im 54/72]
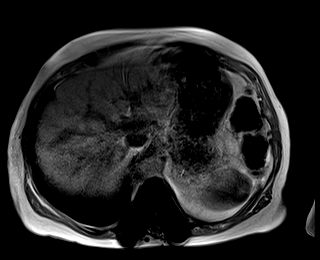
[im 72/72]
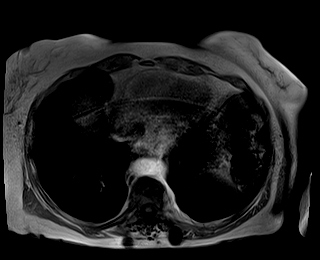

[Series 6: t1_3d_vibe_axial_(person_name)_(person_name) · axial · 3.0mm · 1.19mm/px · z∈[-79,+134]mm · 5 of 72 slices shown (2 of 2)]
[im 1/72]
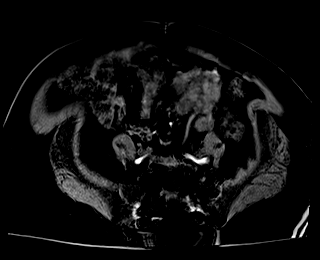
[im 18/72]
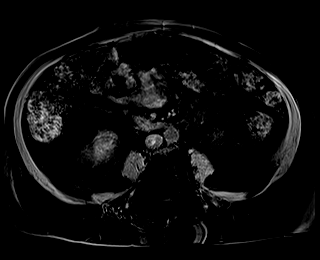
[im 36/72]
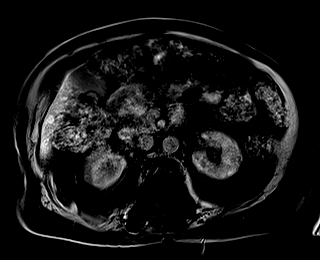
[im 54/72]
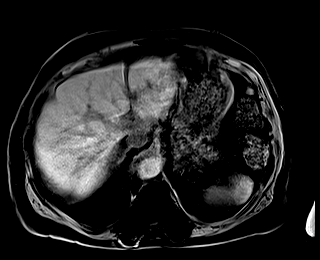
[im 72/72]
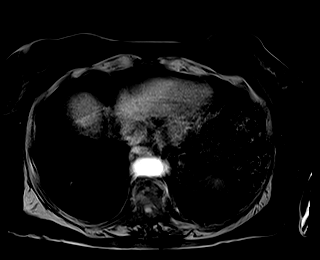

[Series 10: fl3d_ce_cor_+c_sub · coronal · 1.2mm · 1.17mm/px · 8 of 104 slices shown (1 of 2)]
[im 1/104]
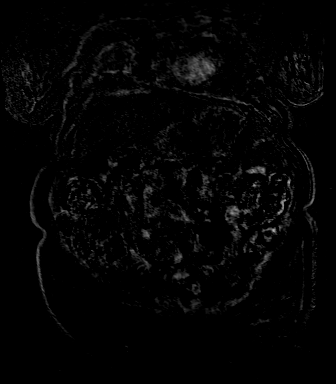
[im 15/104]
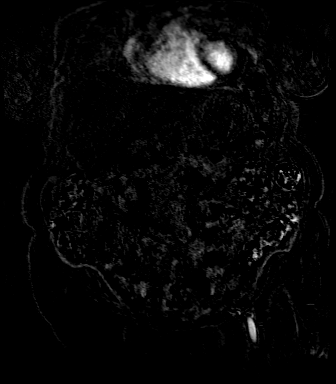
[im 30/104]
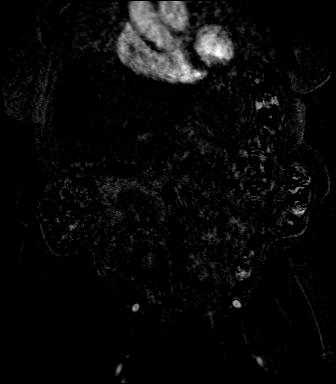
[im 45/104]
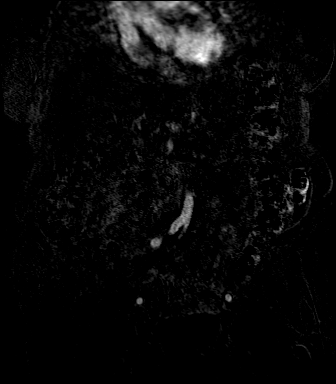
[im 59/104]
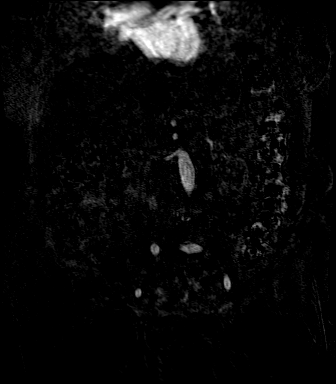
[im 74/104]
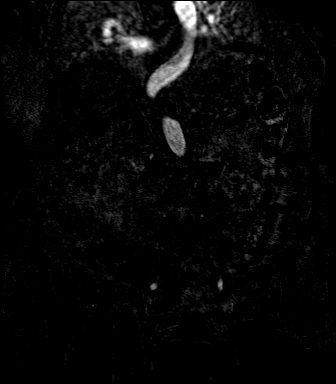
[im 89/104]
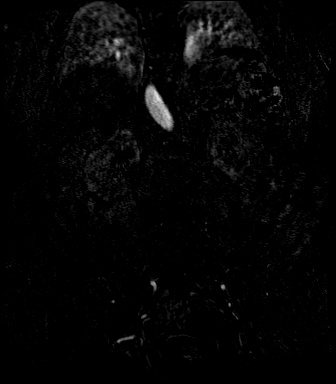
[im 104/104]
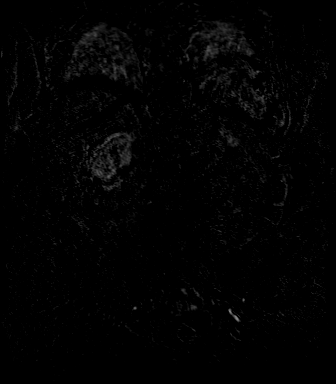

[Series 13: fl3d_ce_cor_+c_sub · coronal · 1.2mm · 1.17mm/px · 8 of 104 slices shown (2 of 2)]
[im 1/104]
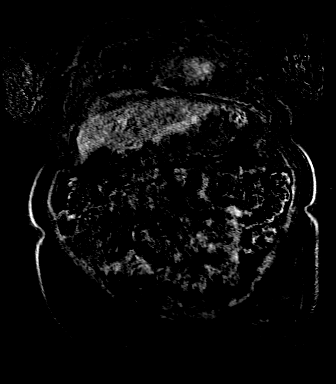
[im 15/104]
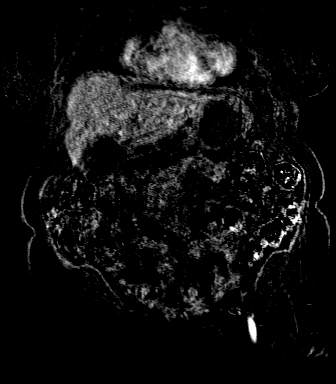
[im 30/104]
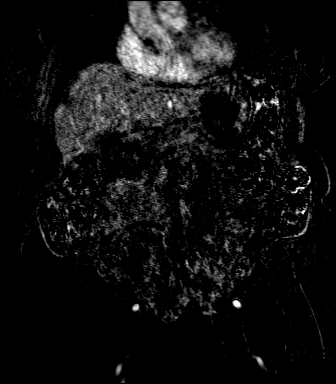
[im 45/104]
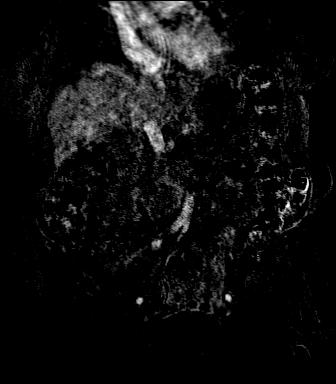
[im 59/104]
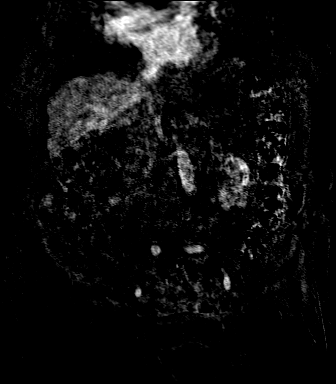
[im 74/104]
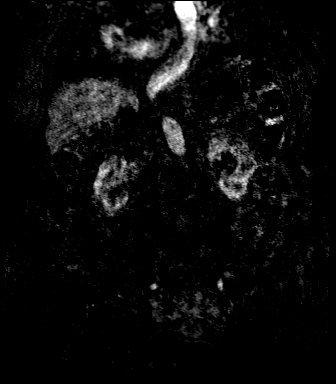
[im 89/104]
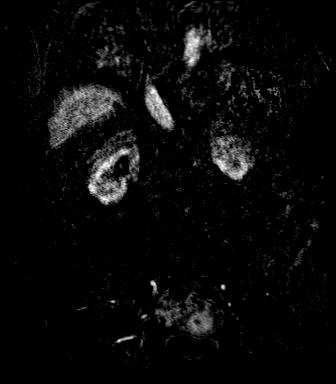
[im 104/104]
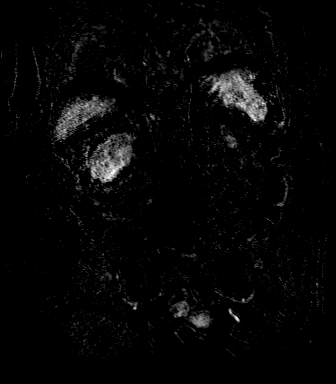

[Series 18: t1_3d_vibe_axial_(person_name)_+c_w · axial · 3.0mm · 1.19mm/px · z∈[-79,+134]mm · 5 of 72 slices shown]
[im 1/72]
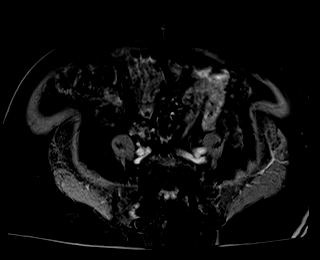
[im 18/72]
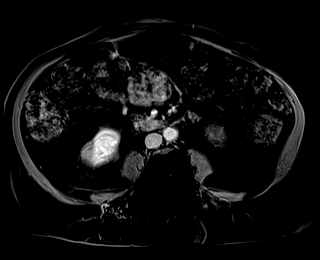
[im 36/72]
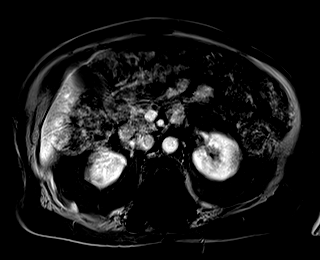
[im 54/72]
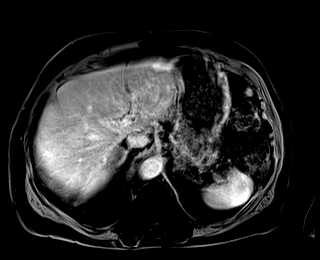
[im 72/72]
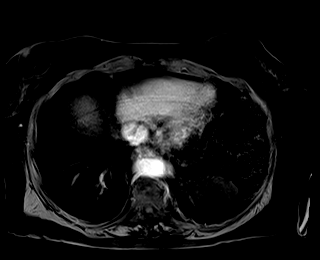

[40 of 40 positions shown; findings below may reference images not displayed]

TECHNIQUE AND FINDINGS: Multiplanar MR images were obtained through the abdomen pre- and post 20 mL of ProHance. MR angiography was performed for the abdominal and pelvic arteries with source and 3-D reconstructed images generated and used for interpretation under concurrent physician supervision.

Liver and spleen have normal size and signal intensity. They enhance appropriately. Considering respiratory motion artifact, gallbladder, pancreas and adrenals are unremarkable. Kidneys have a normal size and position without hydronephrosis. There is very mild perinephric fluid signal present. There is no adenopathy appreciated.

MR angiogram of the study shows a moderately tortuous aorta. The celiac and SMA show widely patent ostia. There are single renal arteries bilaterally which have a grossly normal course and caliber. The iliac arteries are moderately tortuous but have a normal caliber throughout their course through the common femoral arteries.
IMPRESSION: 1. No evidence of hemodynamically significant renal artery stenosis.

2. Mild perinephric fluid signal noted, likely related to the history of chronic renal insufficiency.

## 2020-02-05 IMAGING — CR CHEST 2 VWS PA LAT
1 series · 3 of 3 positions shown · non-contrast
Comparison: None

HISTORY/INDICATIONS:  Bronchitis
TECHNIQUE: Chest 2 views.

[Series 1: pa · 0.17mm/px · 3 of 3 slices shown]
[im 1/3]
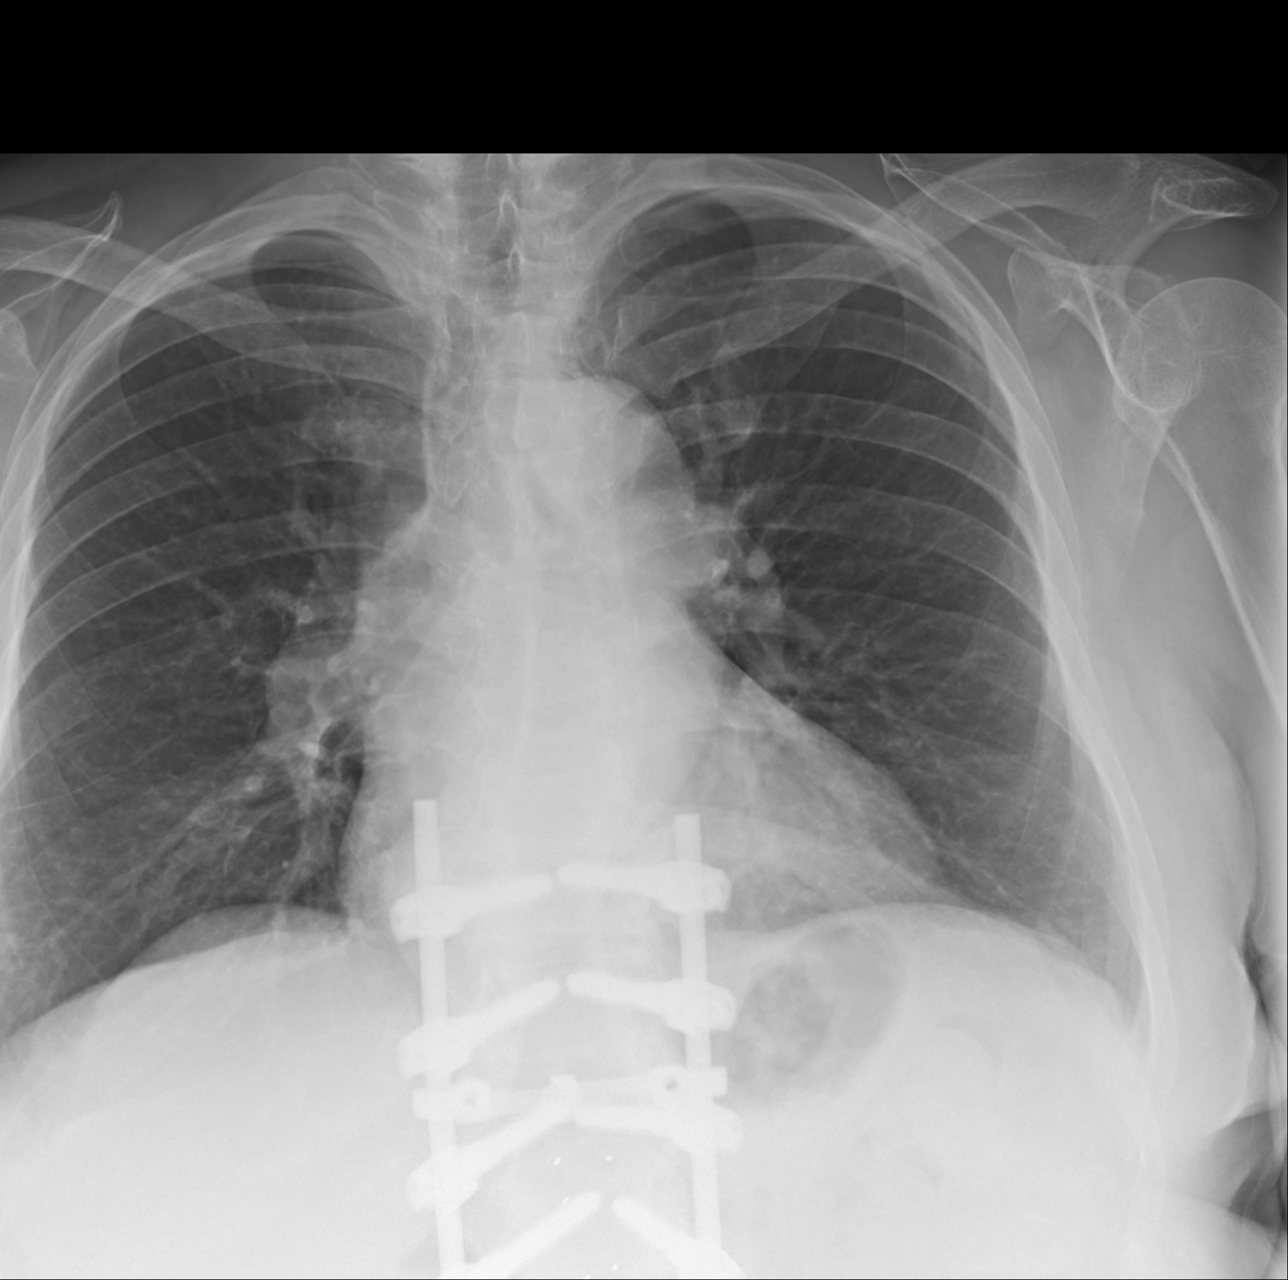
[im 2/3]
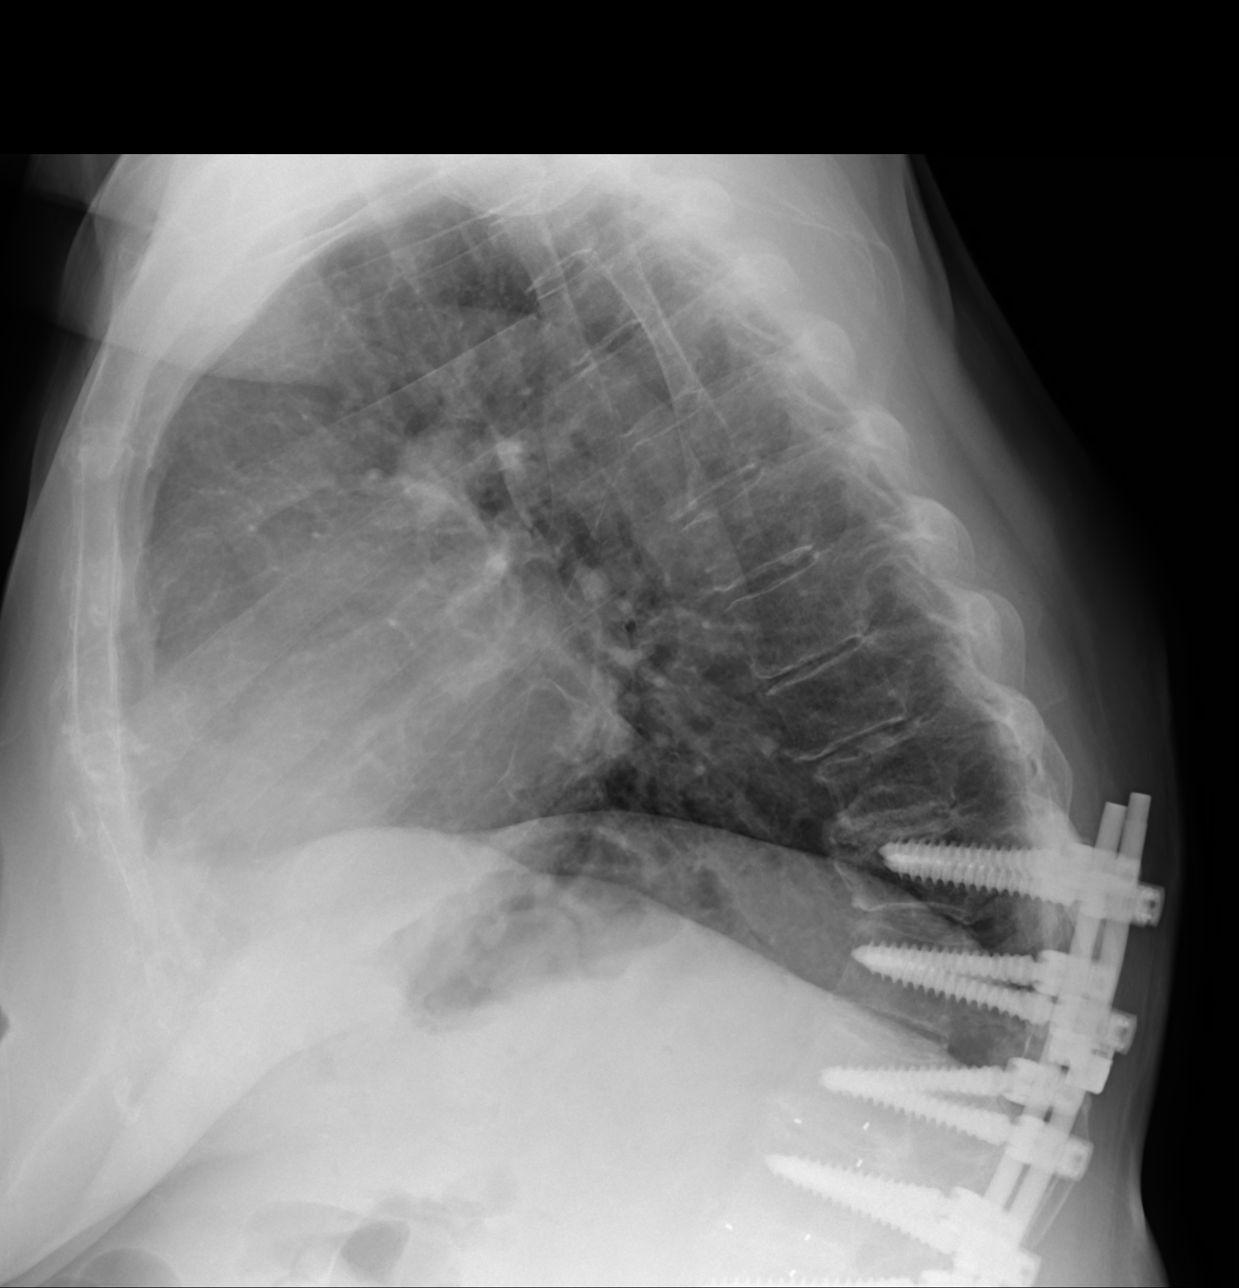
[im 3/3]
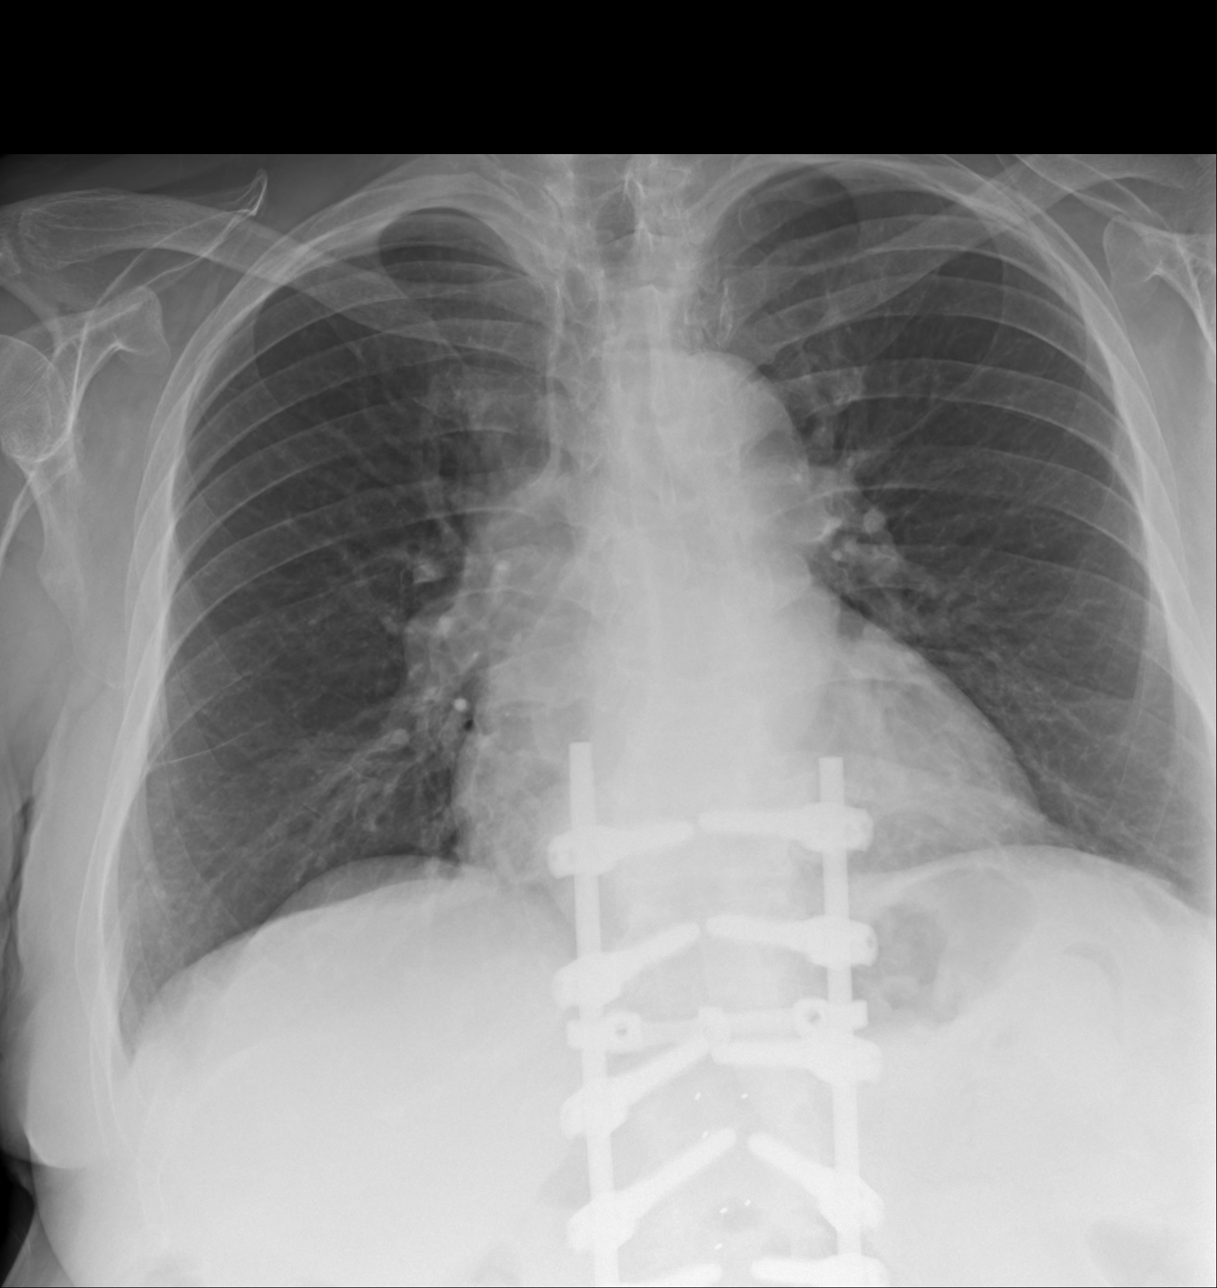

[3 of 3 positions shown; findings below may reference images not displayed]

FINDINGS: The cardiac silhouette is normal. There is atherosclerotic calcification and ectasia of the thoracic aorta.

The lung fields are free of infiltrate, mass or pleural effusion. There is no pneumothorax.

There is normal spinal hardware bridging the thoracolumbar junction.
IMPRESSION: There is no evidence of active cardiopulmonary disease.

Stat fax

## 2020-08-31 IMAGING — MR MRI BRAIN W/WO CONTRAST
11 of 12 series · 47 of 48 positions shown · IV contrast (prohance)
Comparison: None

INDICATION: 74 years-old female, patient reports tingling all over body.
TECHNIQUE: Multiplanar, multisequence MRI of the brain was performed without and with intravenous contrast. The patient received an intravenous dose of 15 mL ProHance.

[Series 5: t1_mpr_axial_pre · axial · 1.0mm · 0.90mm/px · z∈[-63,+95]mm · 7 of 160 slices shown]
[im 1/160]
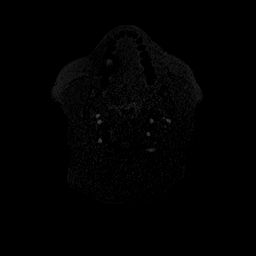
[im 27/160]
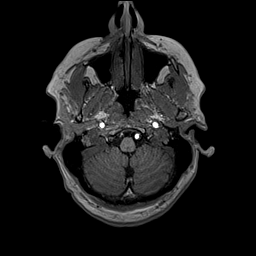
[im 54/160]
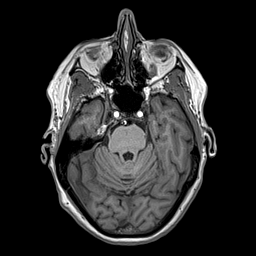
[im 80/160]
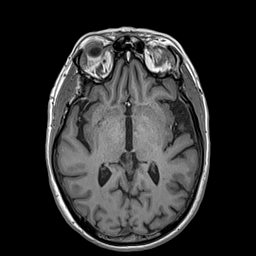
[im 107/160]
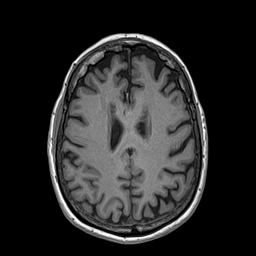
[im 133/160]
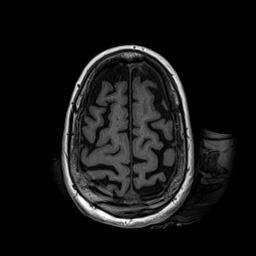
[im 160/160]
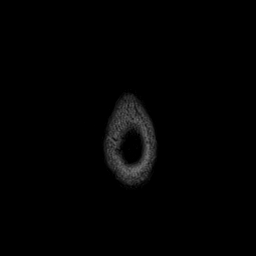

[Series 6: t1_mpr_axial_pre_mpr_sag · sagittal · 1.0mm · 0.90mm/px · 6 of 150 slices shown]
[im 1/150]
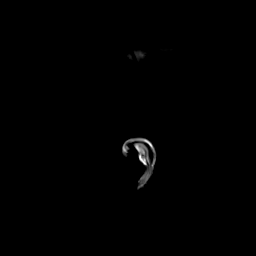
[im 30/150]
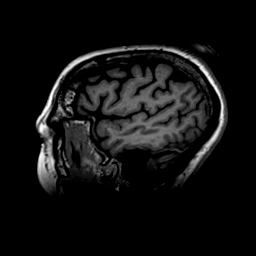
[im 60/150]
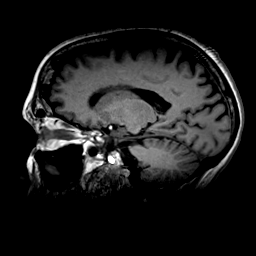
[im 90/150]
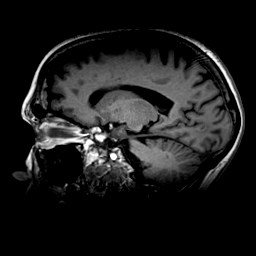
[im 120/150]
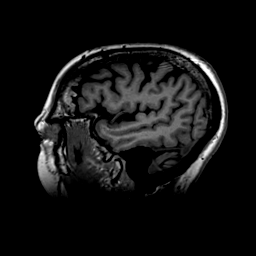
[im 150/150]
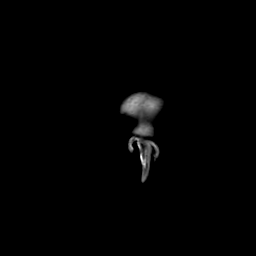

[Series 7: t1_mpr_axial_pre_mpr_cor · coronal · 1.0mm · 0.90mm/px · 8 of 200 slices shown]
[im 1/200]
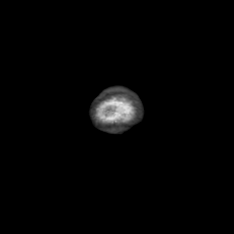
[im 29/200]
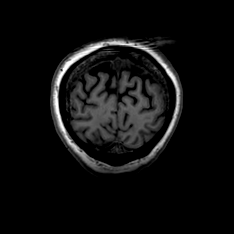
[im 57/200]
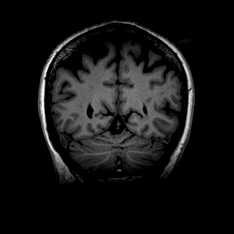
[im 86/200]
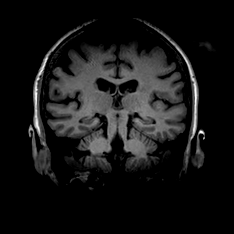
[im 114/200]
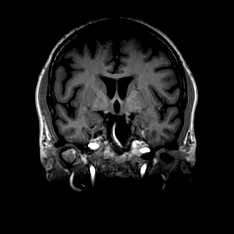
[im 143/200]
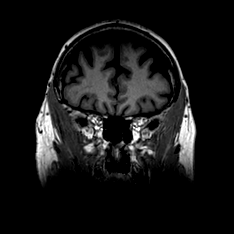
[im 171/200]
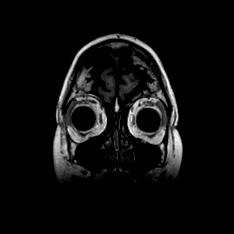
[im 200/200]
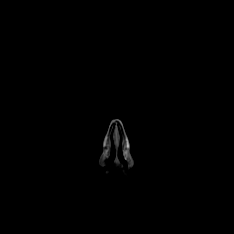

[Series 8: flair_axial_fs · axial · 4.0mm · 0.75mm/px · 1 of 30 slices shown]
[im 1/30]
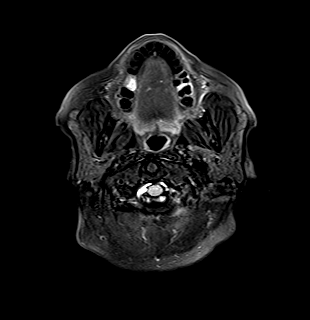

[Series 9: t2_axial · axial · 4.0mm · 0.38mm/px · 1 of 30 slices shown]
[im 1/30]
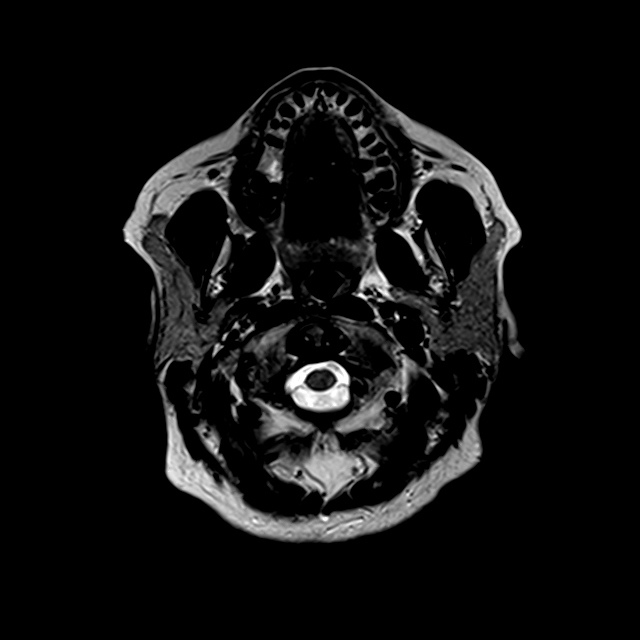

[Series 10: DWI · axial · 4.0mm · 0.86mm/px · 1 of 30 slices shown (1 of 2)]
[im 1/30]
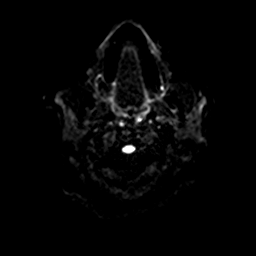

[Series 11: DWI · axial · 4.0mm · 0.86mm/px · 1 of 30 slices shown (2 of 2)]
[im 1/30]
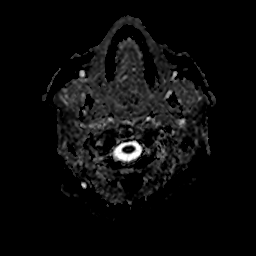

[Series 12: flash_axial · axial · 4.0mm · 0.75mm/px · 1 of 30 slices shown]
[im 1/30]
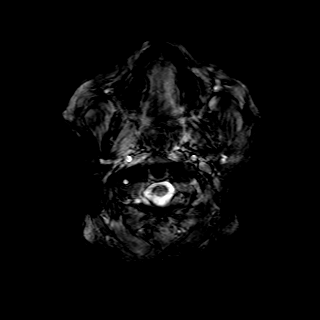

[Series 13: t1_mpr_axial+c · axial · 1.0mm · 0.90mm/px · z∈[-63,+95]mm · 7 of 160 slices shown]
[im 1/160]
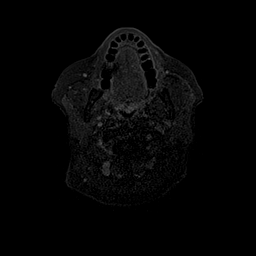
[im 27/160]
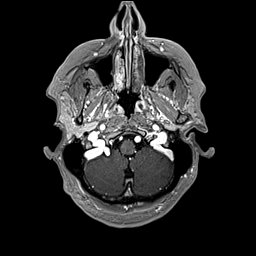
[im 54/160]
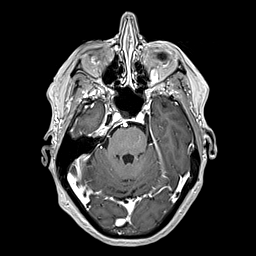
[im 80/160]
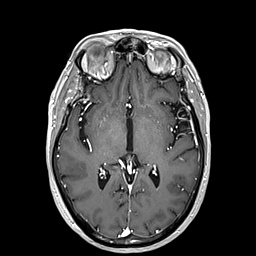
[im 107/160]
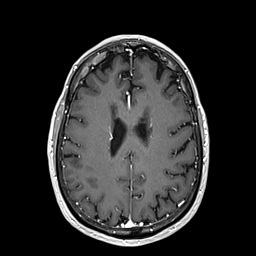
[im 133/160]
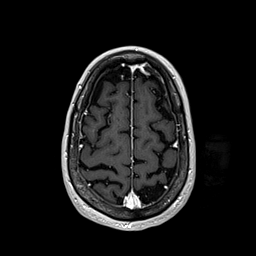
[im 160/160]
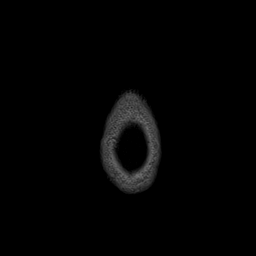

[Series 14: t1_mpr_axial+c_mpr_sag · sagittal · 1.0mm · 0.90mm/px · 6 of 150 slices shown]
[im 1/150]
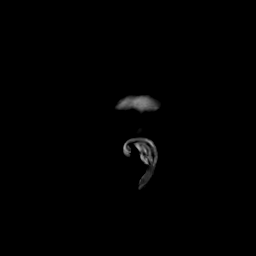
[im 30/150]
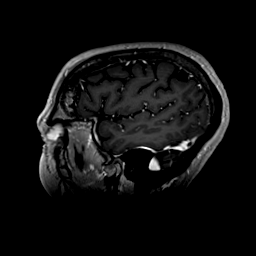
[im 60/150]
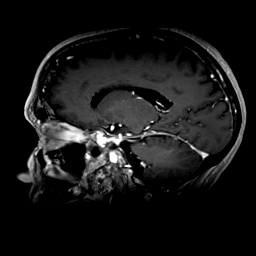
[im 90/150]
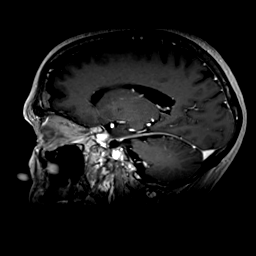
[im 120/150]
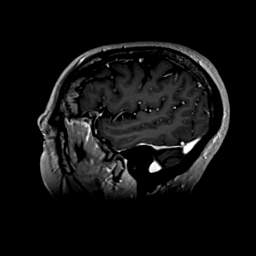
[im 150/150]
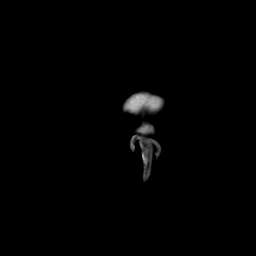

[Series 15: t1_mpr_axial+c_mpr_cor · coronal · 1.0mm · 0.90mm/px · 8 of 200 slices shown]
[im 1/200]
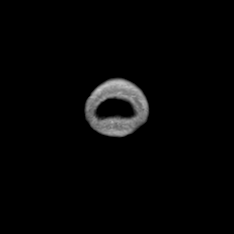
[im 29/200]
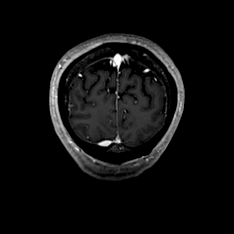
[im 57/200]
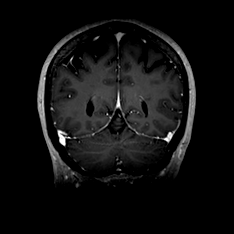
[im 86/200]
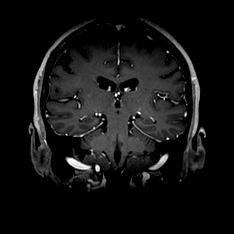
[im 114/200]
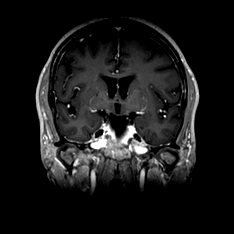
[im 143/200]
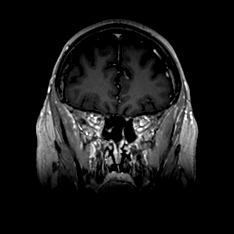
[im 171/200]
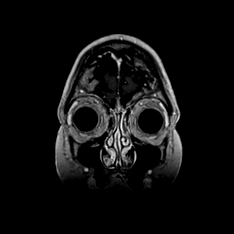
[im 200/200]
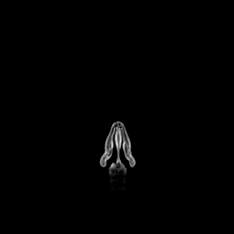

[47 of 48 positions shown; findings below may reference images not displayed]

FINDINGS: Brain parenchyma: No acute infarct or hemorrhage. There are mild focal areas of T2/FLAIR hyperintense signal in the white matter of both cerebral hemispheres, nonspecific, but commonly seen with small vessel ischemic change.

Ventricles: No midline shift, herniation or hydrocephalus.

Extra-axial spaces: No extra-axial fluid collection.

Extracranial structures: Paranasal sinuses, mastoid air cells and middle ear cavities without significant disease. Marrow signal normal. Orbits are unremarkable. Soft tissues normal.

Contrast enhanced views are negative for pathologic enhancement.
IMPRESSION: 1.
No abnormality to explain patient's symptoms.

2.
Mild chronic small vessel ischemic change.

## 2020-09-16 IMAGING — MR MRI LSPINE WO/W CONTRAST
11 series · 48 of 48 positions shown · IV contrast (15cc prohance)
Comparison: Lumbar spine radiographs 07/14/2014, lumbar spine MRI without contrast 10/20/2011

HISTORY: 74-year-old female with lumbar back pain with radiculopathy affecting lower extremity.
TECHNIQUE: Multiplanar, multisequence MR images of the lumbar spine were obtained before and after administration of intravenous contrast. 

CONTRAST: 15 mL of ProHance

[Series 9: iii_aaspine_lspine · coronal · 1.7mm · 1.67mm/px · 19 of 160 slices shown]
[im 1/160]
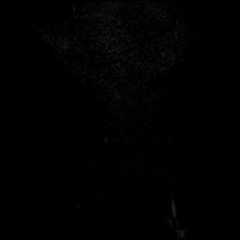
[im 9/160]
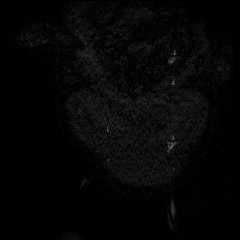
[im 18/160]
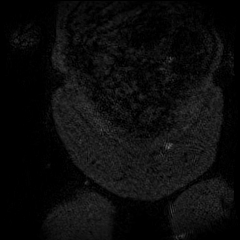
[im 27/160]
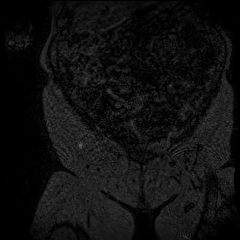
[im 36/160]
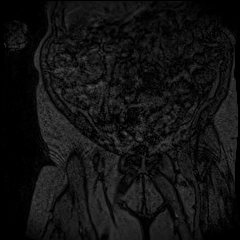
[im 45/160]
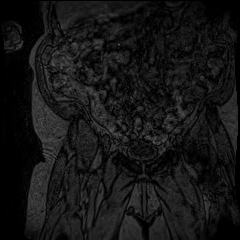
[im 54/160]
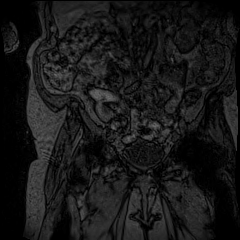
[im 62/160]
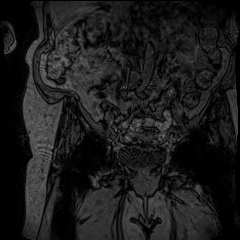
[im 71/160]
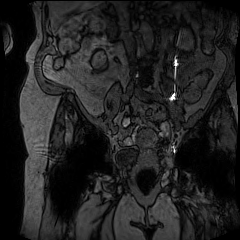
[im 80/160]
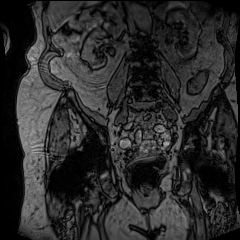
[im 89/160]
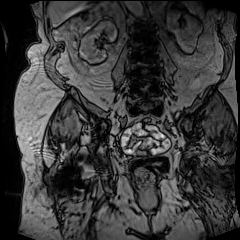
[im 98/160]
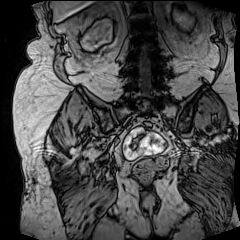
[im 107/160]
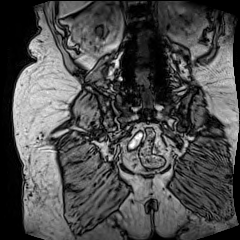
[im 115/160]
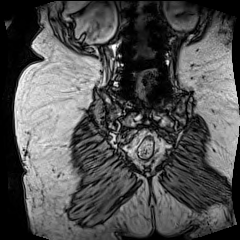
[im 124/160]
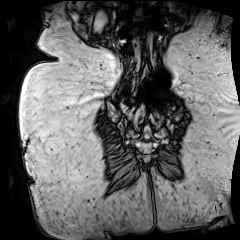
[im 133/160]
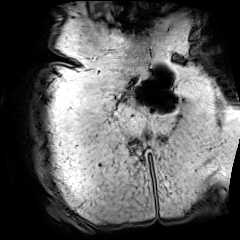
[im 142/160]
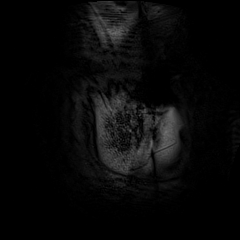
[im 151/160]
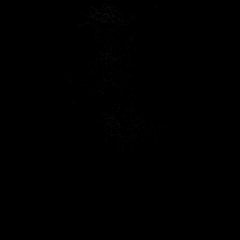
[im 160/160]
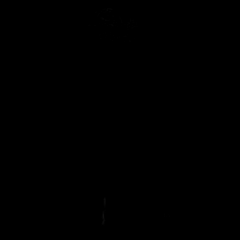

[Series 16: t2_sag · sagittal · 4.0mm · 0.74mm/px · 2 of 17 slices shown]
[im 1/17]
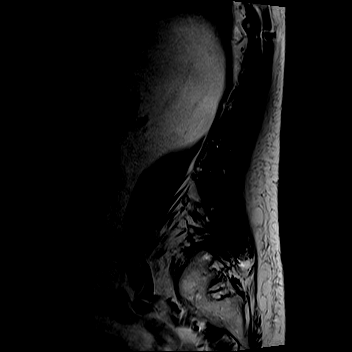
[im 17/17]
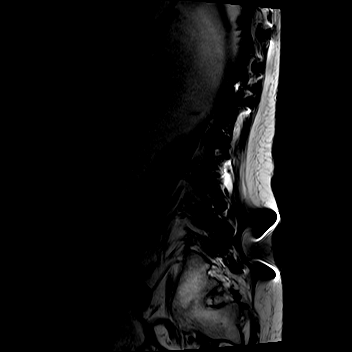

[Series 17: t1_sag · sagittal · 4.0mm · 0.81mm/px · 2 of 17 slices shown]
[im 1/17]
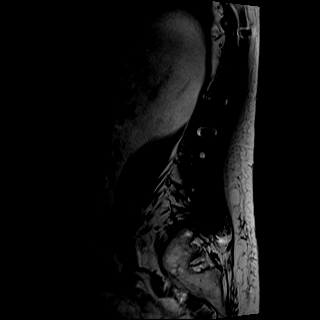
[im 17/17]
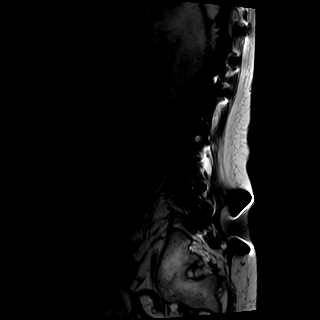

[Series 18: ir_sag · sagittal · 4.0mm · 0.51mm/px · 2 of 17 slices shown]
[im 1/17]
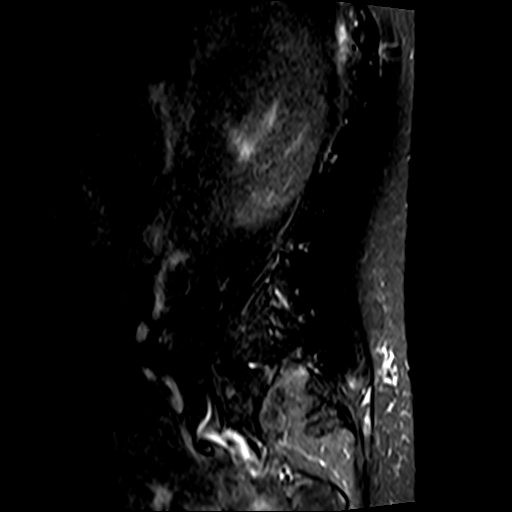
[im 17/17]
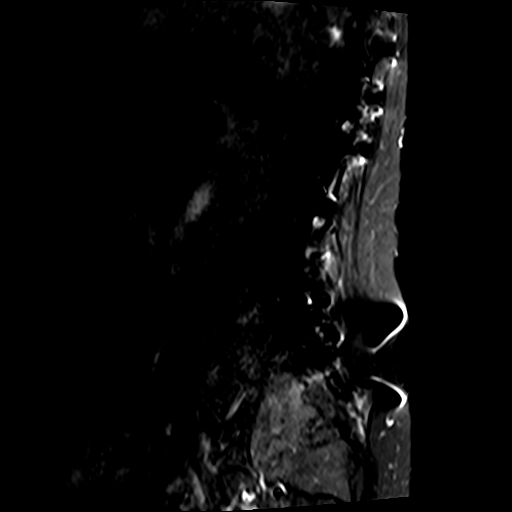

[Series 19: t2_axial · axial · 4.0mm · 0.62mm/px · z∈[-430,-236]mm · 4 of 40 slices shown]
[im 1/40]
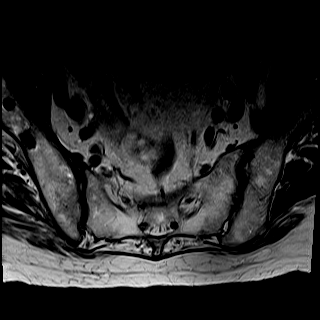
[im 14/40]
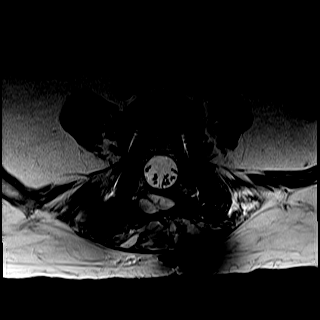
[im 27/40]
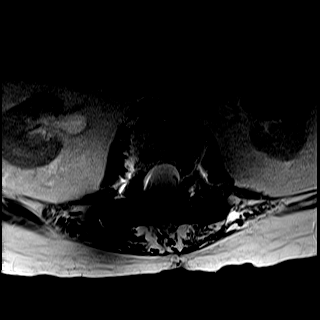
[im 40/40]
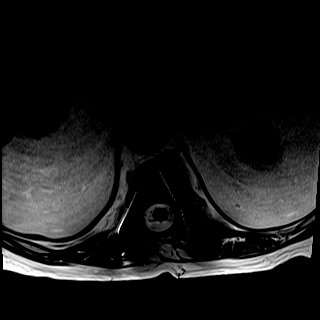

[Series 20: t1_axial_obl · axial · 3.0mm · 0.43mm/px · z∈[-439,-249]mm · 3 of 26 slices shown]
[im 1/26]
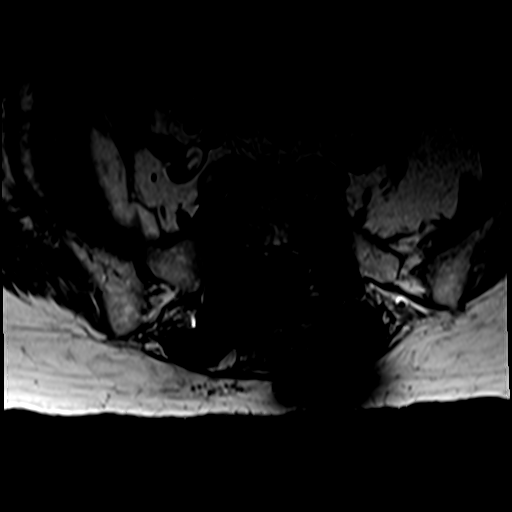
[im 13/26]
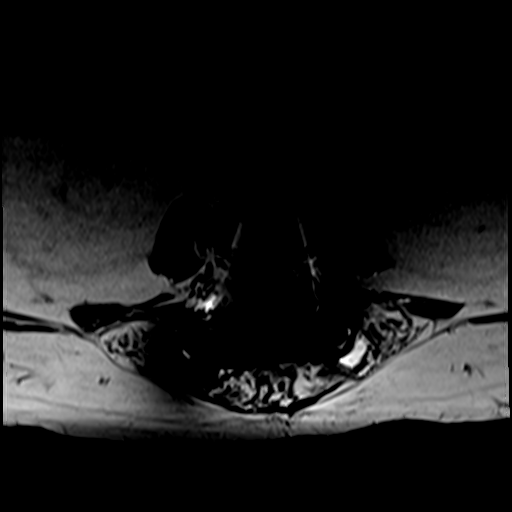
[im 26/26]
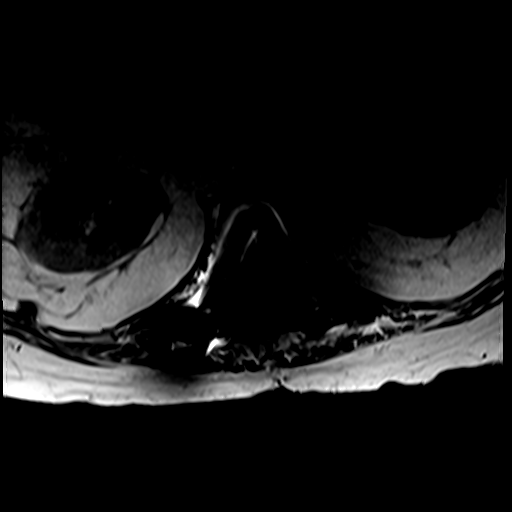

[Series 21: t1_axial_pre · axial · 4.0mm · 0.78mm/px · z∈[-430,-236]mm · 4 of 40 slices shown]
[im 1/40]
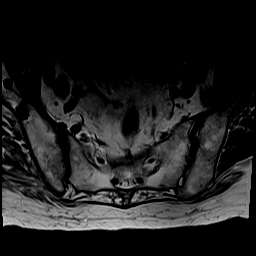
[im 14/40]
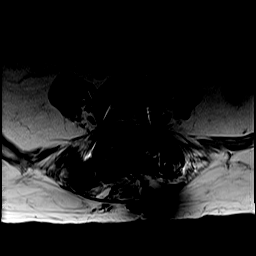
[im 27/40]
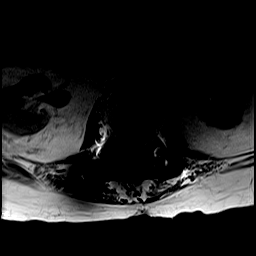
[im 40/40]
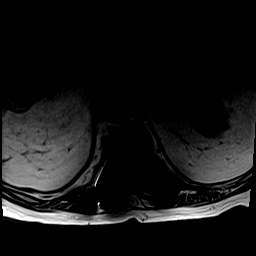

[Series 22: t1_axial_+c · axial · 4.0mm · 0.78mm/px · z∈[-430,-236]mm · 4 of 40 slices shown]
[im 1/40]
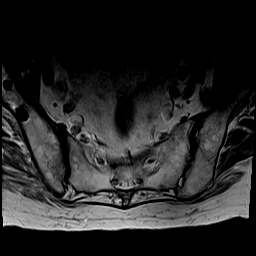
[im 14/40]
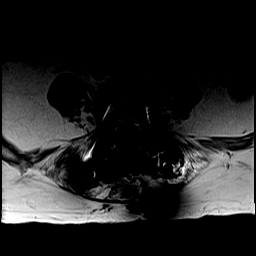
[im 27/40]
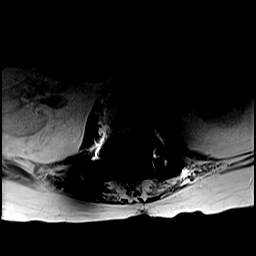
[im 40/40]
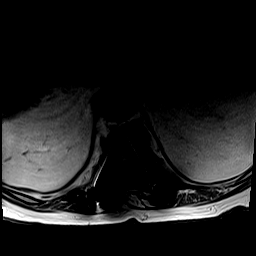

[Series 23: t1_axial_+c_sub · axial · 4.0mm · 0.78mm/px · z∈[-430,-236]mm · 4 of 40 slices shown]
[im 1/40]
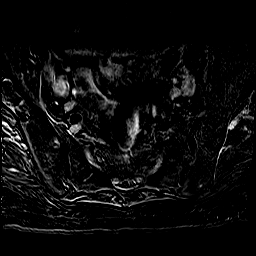
[im 14/40]
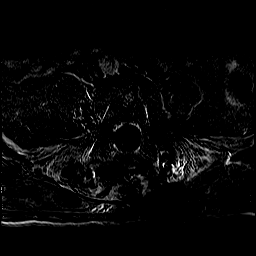
[im 27/40]
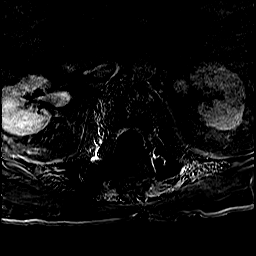
[im 40/40]
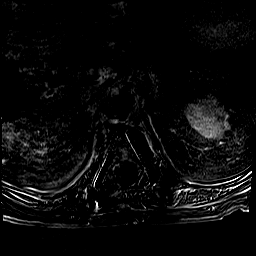

[Series 24: t1_sag_+c · sagittal · 4.0mm · 0.81mm/px · 2 of 17 slices shown]
[im 1/17]
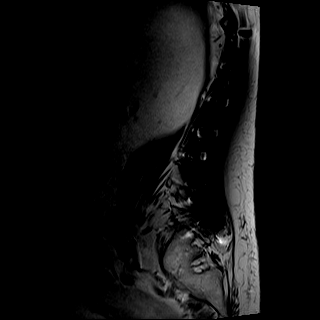
[im 17/17]
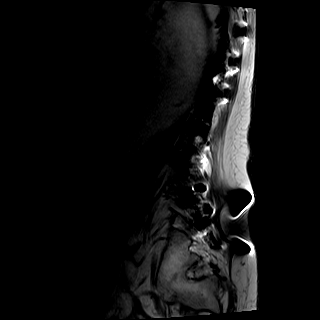

[Series 25: t1_sag_+c_sub · sagittal · 4.0mm · 0.81mm/px · 2 of 17 slices shown]
[im 1/17]
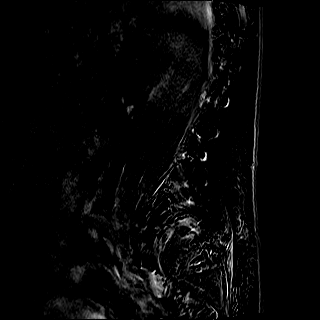
[im 17/17]
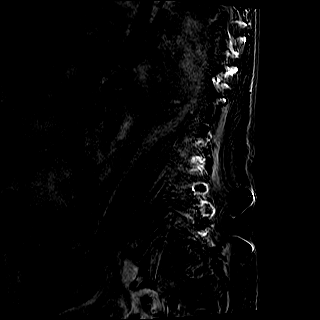

[48 of 48 positions shown; findings below may reference images not displayed]

FINDINGS: COUNT/LABELING: Please note that spinal labeling was performed assuming there are 5 non-rib-bearing lumbar type vertebrae.

ALIGNMENT:  No subluxations.

VERTEBRAE: Diffuse thoracolumbar-sacral fusion and posterior decompression.

PARASPINAL SOFT TISSUES: Postoperative signal changes. Metallic artifact.

DISTAL THORACIC SPINAL CORD AND CONUS MEDULLARIS: The distal thoracic spinal cord is normal in caliber and signal with the conus medullaris terminating at L2-3, which is low normal.

CAUDA EQUINA NERVE ROOTS: Unremarkable.

FINDINGS BY LEVEL:

T12-L1: No high-grade canal stenosis or foraminal narrowing.

L1-L2:  No high-grade canal stenosis or foraminal narrowing.

L2-L3:  No high-grade canal stenosis or foraminal narrowing.

L3-L4:  No high-grade canal stenosis or foraminal narrowing. 

L4-L5:  No high-grade canal stenosis or foraminal narrowing.

L5-S1:  Diffuse disc bulge, facet arthropathy, endplate spurring and possible scarring contribute to moderate bilateral foraminal narrowing with possible impingement of the exiting bilateral L5 nerve roots.

OTHER: Moderate bilateral SI joint osteoarthritis.
IMPRESSION: 1.
Extensive postoperative changes.

2.
Moderate bilateral L5-S1 foraminal narrowing with possible impingement of the exiting bilateral L5 nerve roots.

3.
Moderate bilateral SI joint osteoarthritis.

## 2020-09-16 IMAGING — MR MRI TSPINE WO/W CONTRAST
11 of 14 series · 34 of 48 positions shown · IV contrast (prohance)
Comparison: None.

HISTORY: 74 year old female with mid back pain
TECHNIQUE: Multiplanar, multisequence MR images of the thoracic spine were obtained before and after administration of intravenous contrast.  

CONTRAST: 15 mL of ProHance.

[Series 5: ii_aaspine_tspine · coronal · 1.7mm · 1.67mm/px · 8 of 160 slices shown]
[im 1/160]
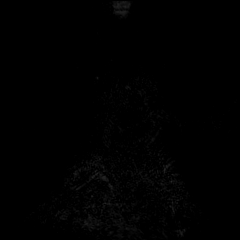
[im 25/160]
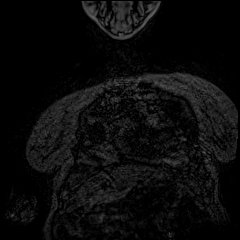
[im 49/160]
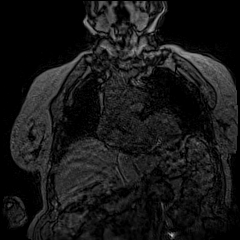
[im 74/160]
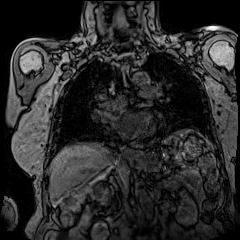
[im 86/160]
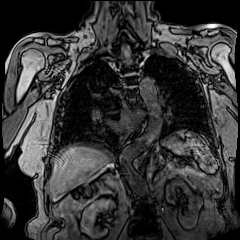
[im 111/160]
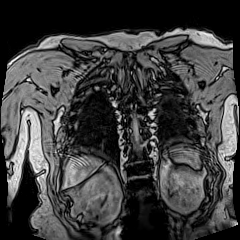
[im 135/160]
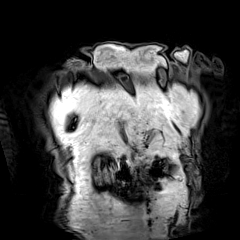
[im 160/160]
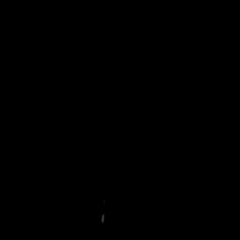

[Series 16: t2_sag · sagittal · 3.0mm · 0.89mm/px · 2 of 23 slices shown]
[im 1/23]
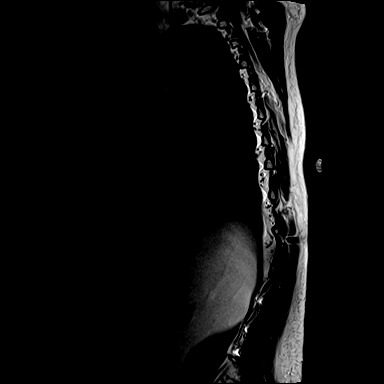
[im 23/23]
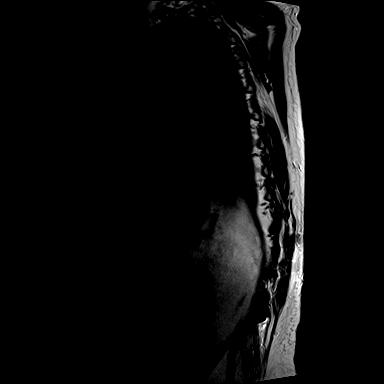

[Series 17: t1_sag · sagittal · 3.0mm · 0.89mm/px · 2 of 23 slices shown]
[im 1/23]
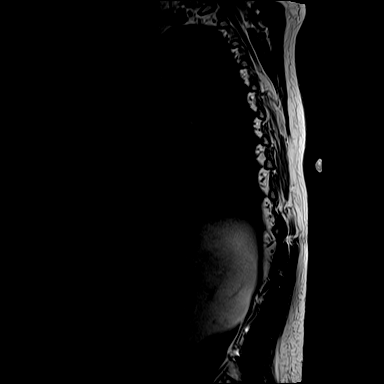
[im 23/23]
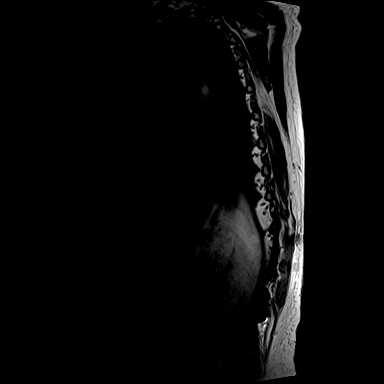

[Series 18: ir_sag · sagittal · 3.0mm · 0.66mm/px · 2 of 23 slices shown]
[im 1/23]
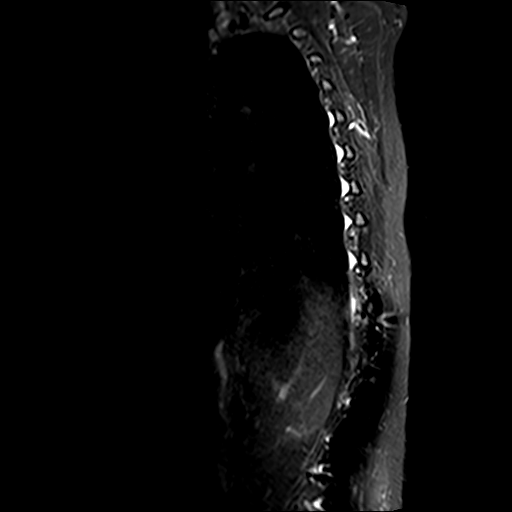
[im 23/23]
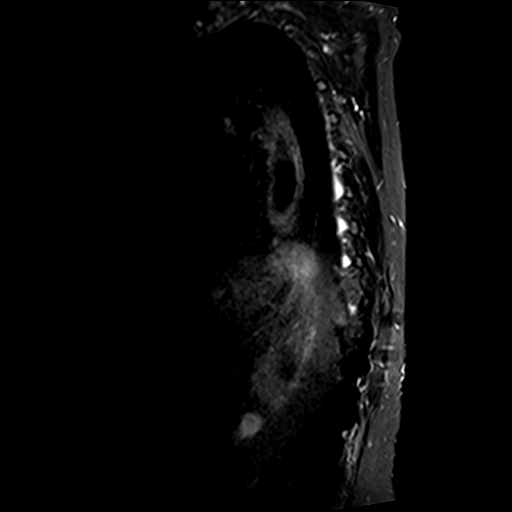

[Series 19: t2_axial · axial · 3.5mm · 0.62mm/px · z∈[-187,-55]mm · 3 of 30 slices shown (1 of 2)]
[im 1/30]
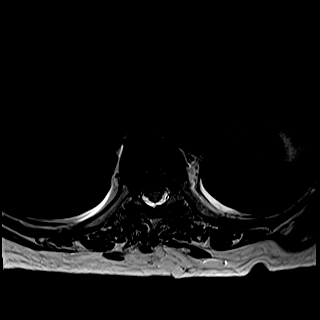
[im 15/30]
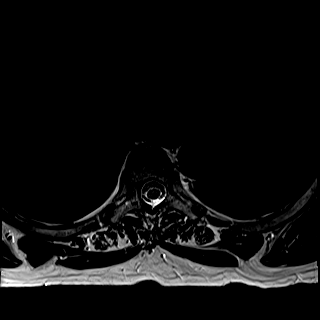
[im 30/30]
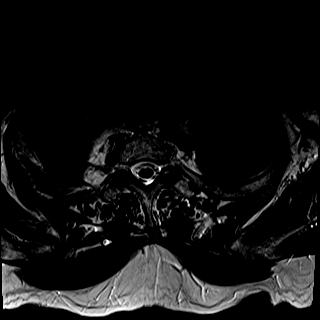

[Series 20: t2_axial · axial · 3.5mm · 0.62mm/px · z∈[-308,-137]mm · 3 of 38 slices shown (2 of 2)]
[im 1/38]
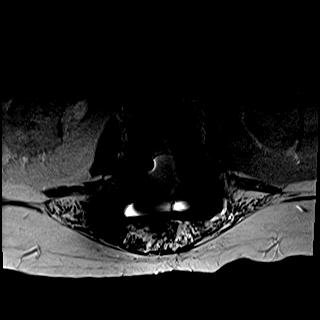
[im 19/38]
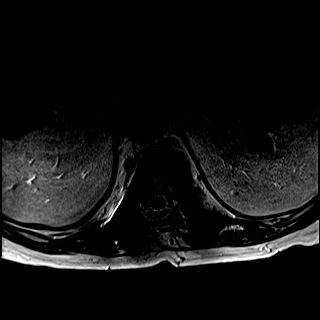
[im 38/38]
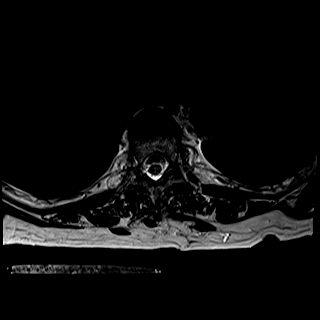

[Series 21: t1_axial_pre · axial · 3.5mm · 0.78mm/px · z∈[-191,-50]mm · 3 of 32 slices shown (1 of 2)]
[im 1/32]
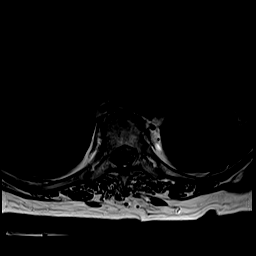
[im 16/32]
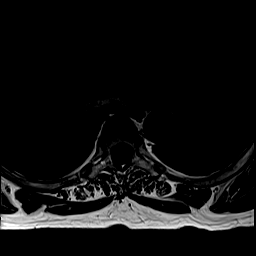
[im 32/32]
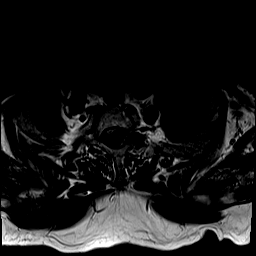

[Series 22: t1_axial_pre · axial · 3.5mm · 0.39mm/px · z∈[-313,-133]mm · 3 of 40 slices shown (2 of 2)]
[im 1/40]
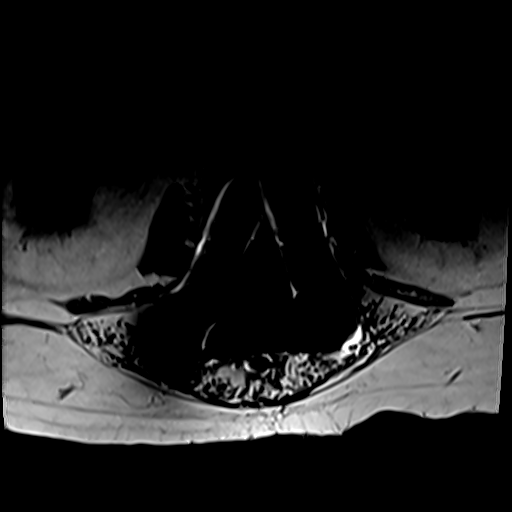
[im 20/40]
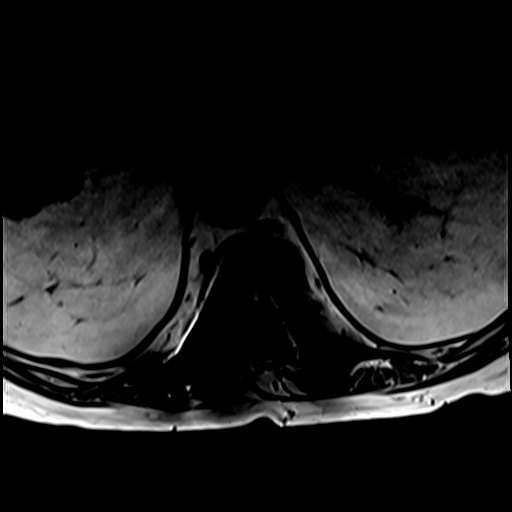
[im 40/40]
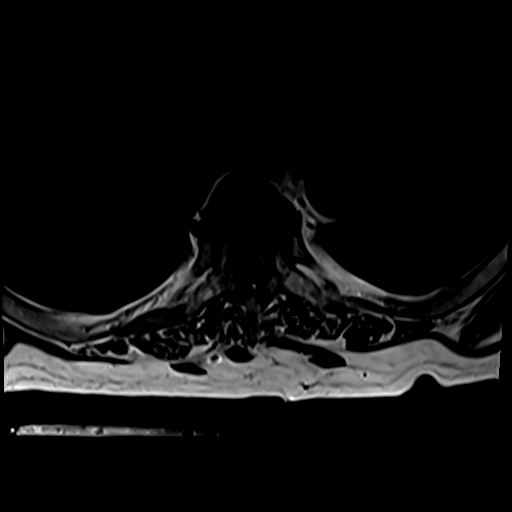

[Series 23: t1_axial_+c · axial · 3.5mm · 0.78mm/px · z∈[-191,-50]mm · 3 of 32 slices shown (1 of 2)]
[im 1/32]
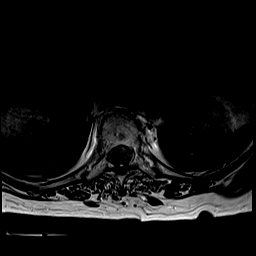
[im 16/32]
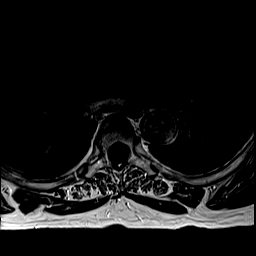
[im 32/32]
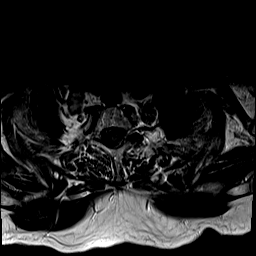

[Series 24: t1_axial_+c · axial · 3.5mm · 0.39mm/px · z∈[-313,-133]mm · 3 of 40 slices shown (2 of 2)]
[im 1/40]
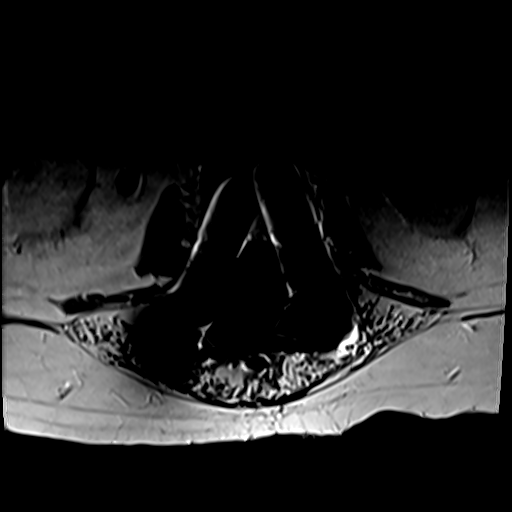
[im 20/40]
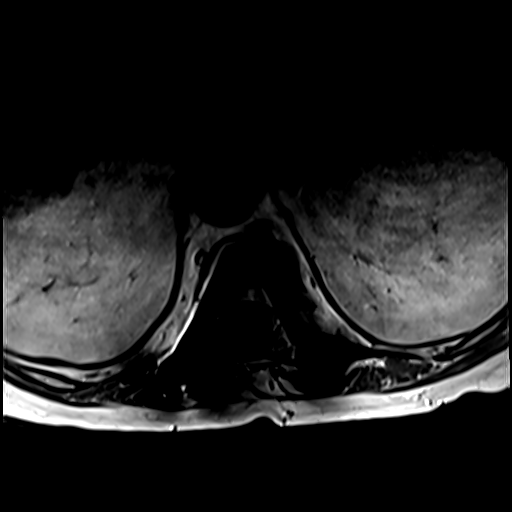
[im 40/40]
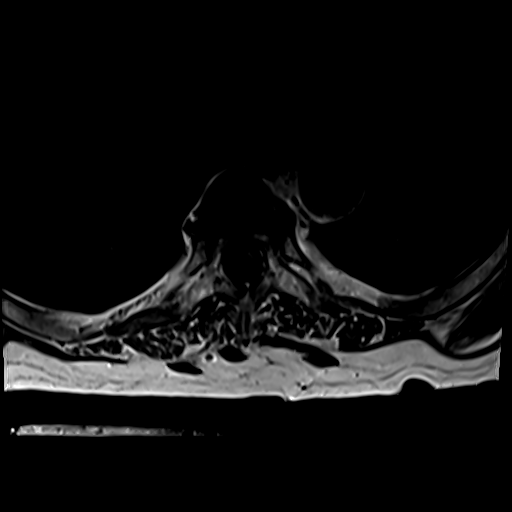

[Series 25: t1_sag _+c · sagittal · 3.0mm · 0.89mm/px · 2 of 23 slices shown]
[im 1/23]
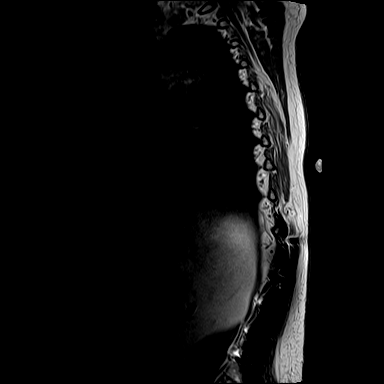
[im 23/23]
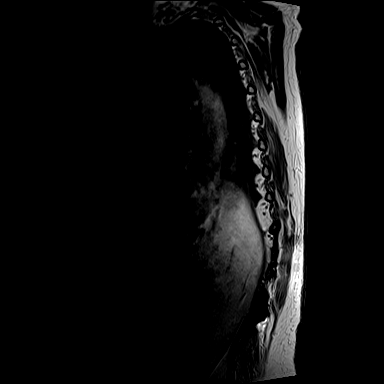

[34 of 48 positions shown; findings below may reference images not displayed]

FINDINGS: COUNT/LABELING: Please note that spinal labeling was performed assuming there are 12 rib-bearing thoracic vertebrae.

ALIGNMENT: Kyphotic deformity is noted at the T9-10 junction. Multilevel degenerative signal changes.

VERTEBRAE: Previous thoracolumbar fusion and posterior decompression at T10-L3. Vertebral body heights are maintained.

INTERVERTEBRAL DISCS:  Unfused disc symmetry multilevel disc desiccation with moderate height loss at T7-8 and T9-10.

PARASPINAL SOFT TISSUES:  Postoperative signal changes and metallic artifact is noted in the paraspinal soft tissues of the lower thoracolumbar junction. No other soft tissue abnormalities. No abnormal enhancement identified.

SPINAL CORD:  The thoracic spinal cord is normal in caliber and signal.

FINDINGS BY LEVEL:

T1-2: Disc bulge, facet arthropathy and endplate spurring contribute to moderate bilateral foraminal narrowing. No high-grade canal stenosis.

T2-3: No high-grade canal stenosis or foraminal narrowing.

T3-4: No high-grade canal stenosis or foraminal narrowing.

T4-5: No high-grade canal stenosis or foraminal narrowing.

T5-6: No high-grade canal stenosis or foraminal narrowing.

T6-7: No high-grade canal stenosis or foraminal narrowing.

T7-8: No high-grade canal stenosis or foraminal narrowing.

T8-9: Disc bulge, facet arthropathy and endplate spurring contribute to moderate bilateral foraminal narrowing. No high-grade canal stenosis.

T9-10: No high-grade canal stenosis or foraminal narrowing.

T10-11: No high-grade canal stenosis or foraminal narrowing.

T11-12: No high-grade canal stenosis or foraminal narrowing.

T12-L1: No high-grade canal stenosis or foraminal narrowing.
IMPRESSION: 1.
Previous thoracolumbar fusion and posterior decompression at T10-L3. There is a kyphotic deformity with fulcrum at T9-10

2.
Moderate bilateral T1-2 and T8-9 foraminal narrowing.

## 2020-09-30 IMAGING — MG MAMMO SCRN BIL W/CAD TOMO
8 series · 8 of 24 positions shown · non-contrast
Comparison: The present examination has been compared to prior imaging studies.

Images Obtained from [facility]
INDICATION: Screening.
TECHNIQUE: Bilateral 2-D digital screening mammogram was performed followed by 3-D tomosynthesis.  Current study was also evaluated with a computer aided detection (CAD) system.
MAMMOGRAM FINDINGS:
There are scattered areas of fibroglandular density.
No suspicious abnormality is seen in either breast.  There are no significant changes from the prior study.

[L CC]
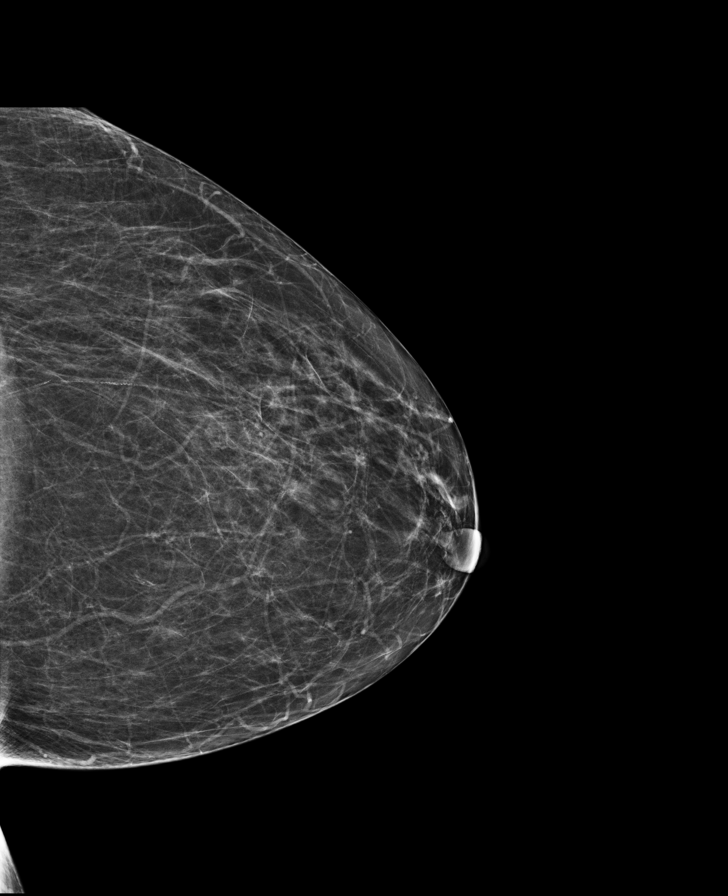

[L MLO]
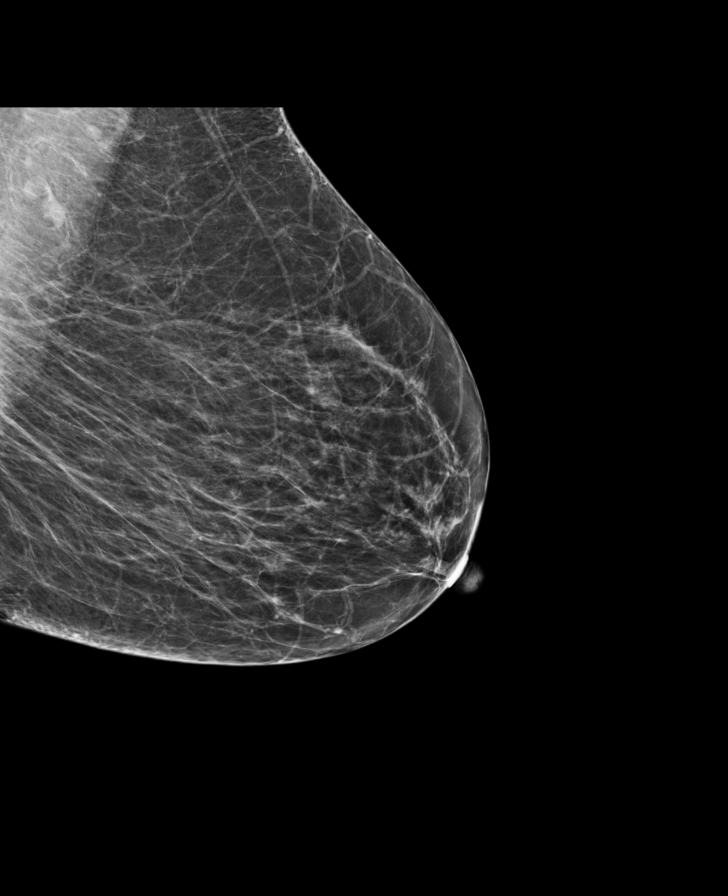

[R CC]
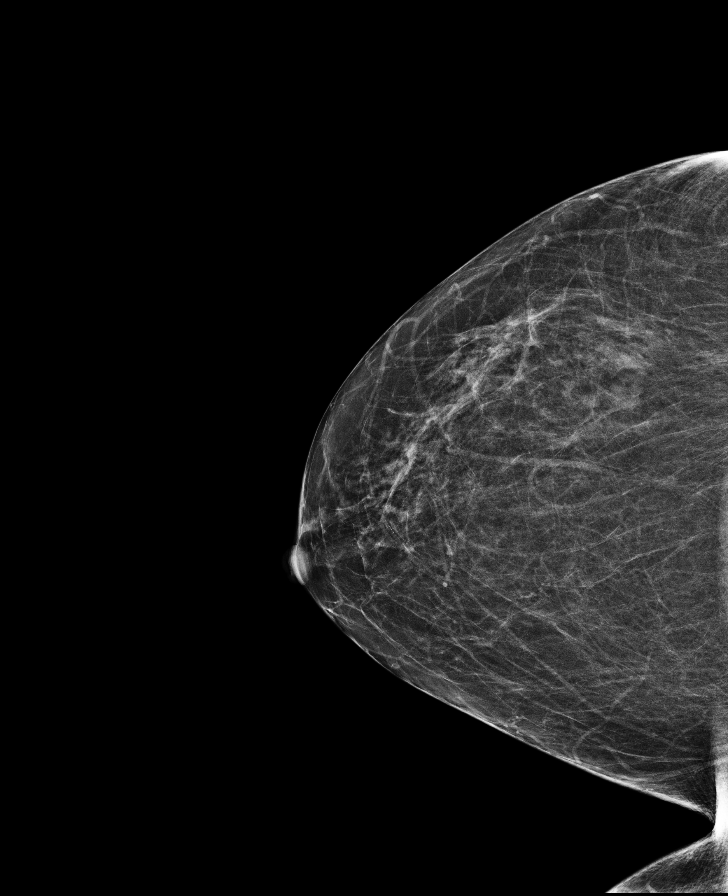

[R MLO]
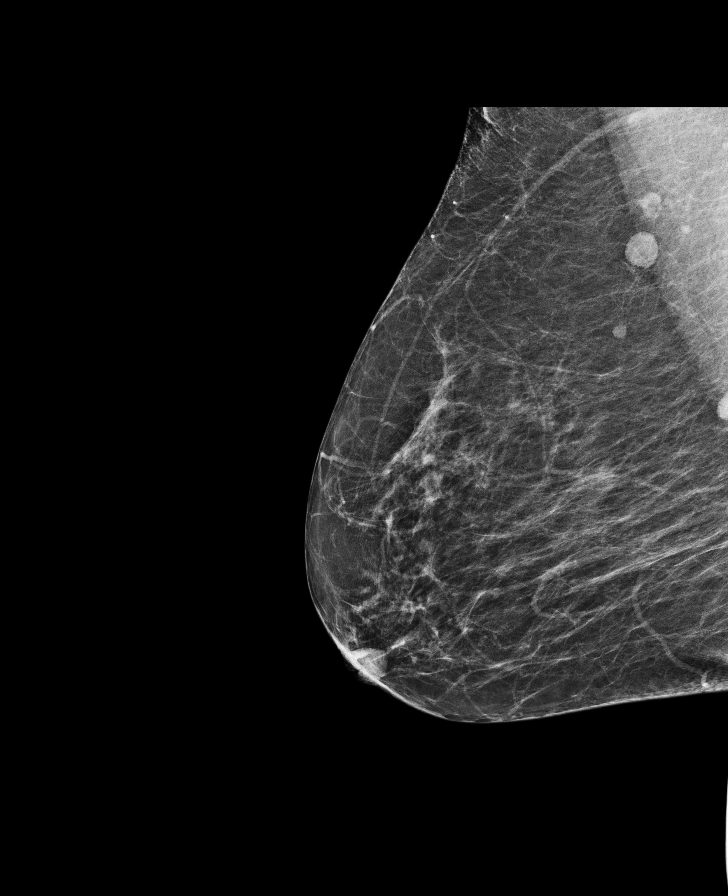

[R MLO tomo · tomo slice 31/62.0]
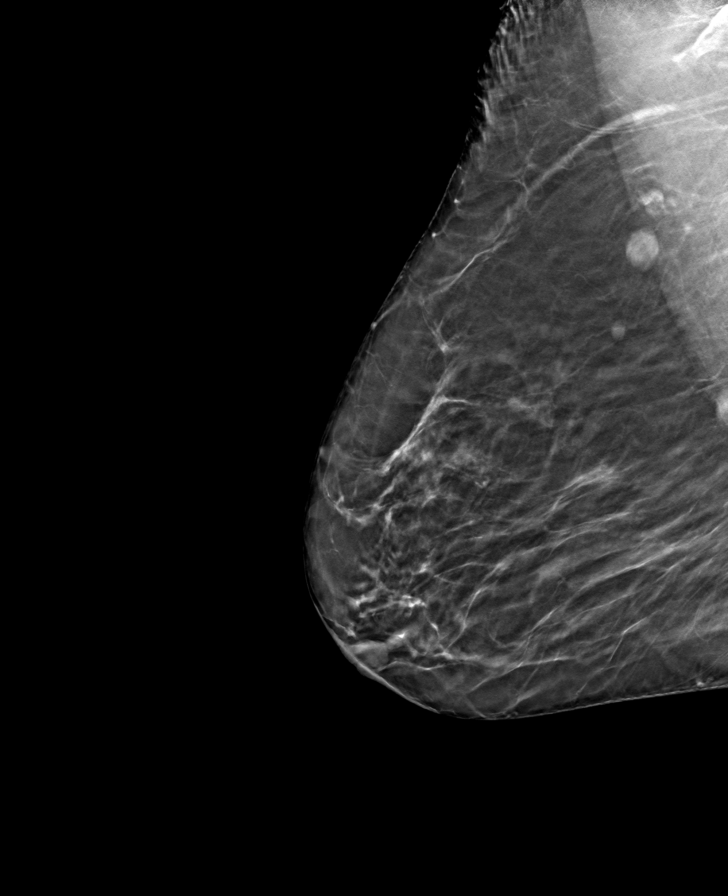

[L MLO tomo · tomo slice 29/58.0]
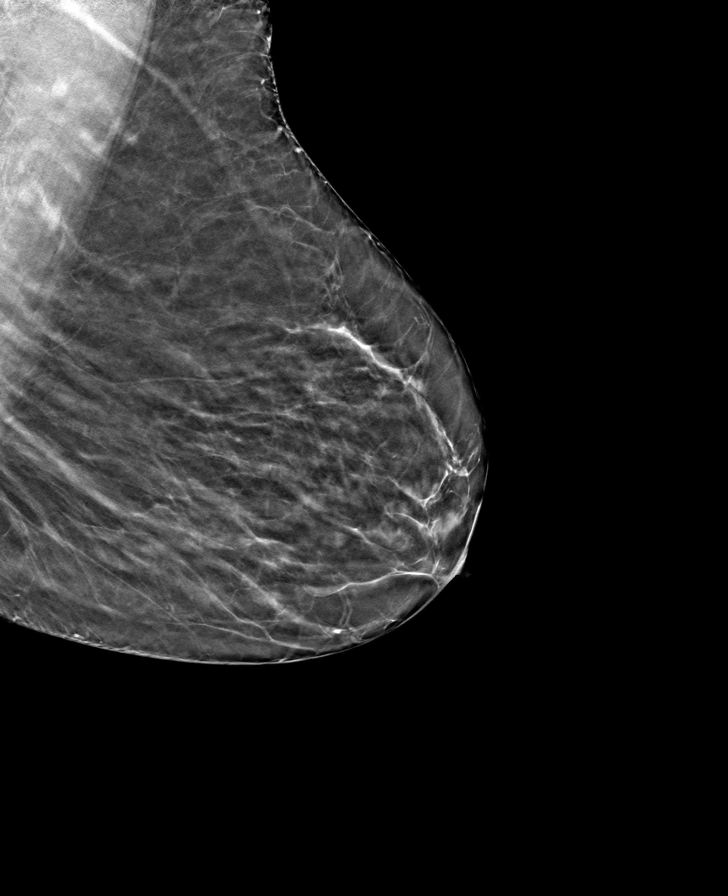

[R CC tomo · tomo slice 29/58.0]
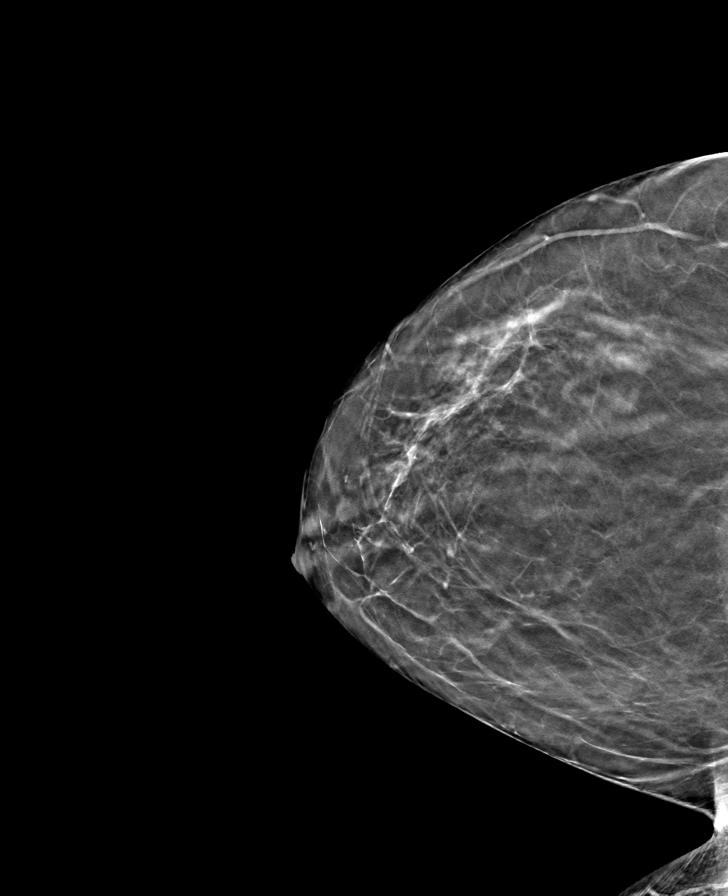

[L CC tomo · tomo slice 29/56.0]
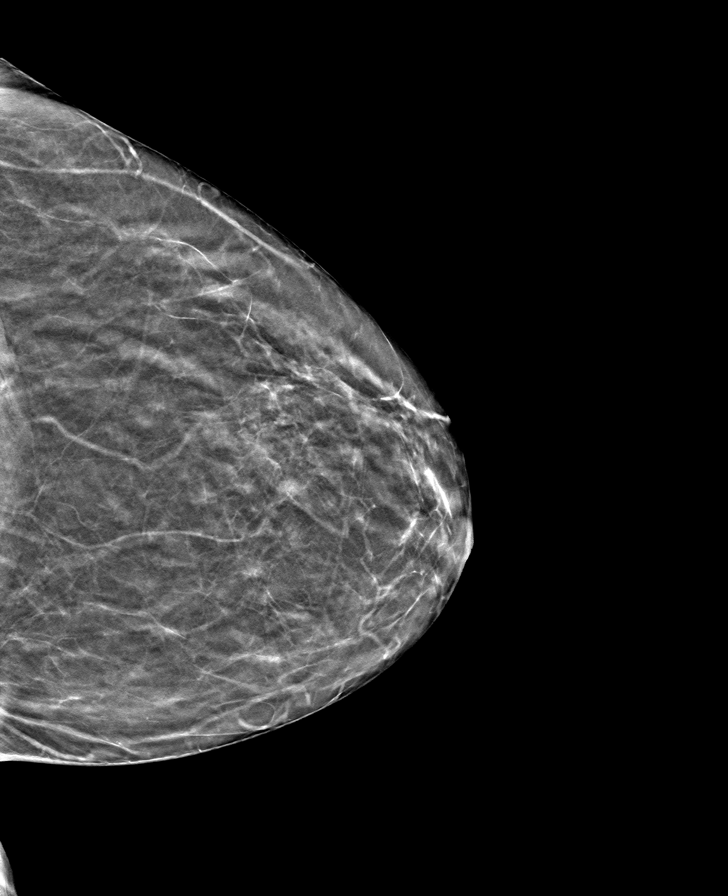

[8 of 24 positions shown; findings below may reference images not displayed]

IMPRESSION: There is no mammographic evidence of malignancy.
Screening mammogram recommended in 1 year.
BI-RADS Category 1: Negative

## 2020-10-01 ENCOUNTER — Ambulatory Visit: Payer: PRIVATE HEALTH INSURANCE

## 2021-01-01 IMAGING — US US SOFT TISSUE NECK/THYROID
1 series · 13 of 25 positions shown · non-contrast
Comparison: Previous soft tissue neck/thyroid ultrasound 11/20/2019, 10/04/2017 and 03/23/2017

HISTORY: Thyroid nodule
TECHNIQUE: Real-time, gray-scale and color Doppler imaging was performed of the bilateral thyroid lobes and isthmus.

[Series 1: us soft tissue neck/thyroid · 43 acquisitions, 13 frames shown]
[im 1/43]
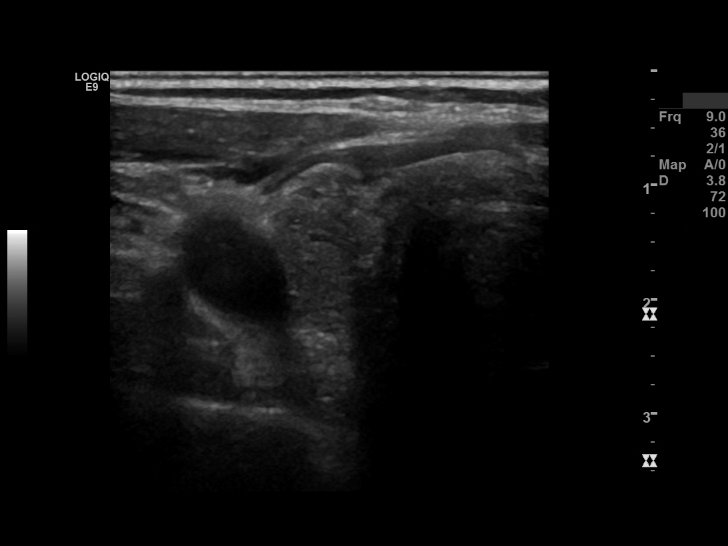
[im 4/43]
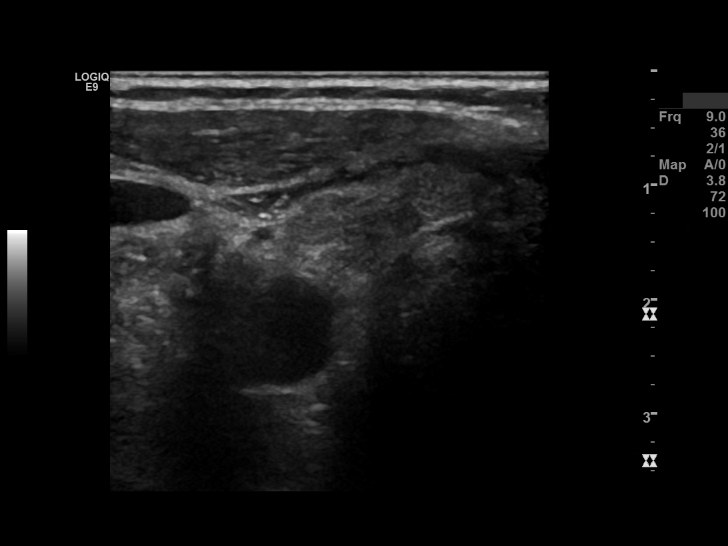
[im 8/43]
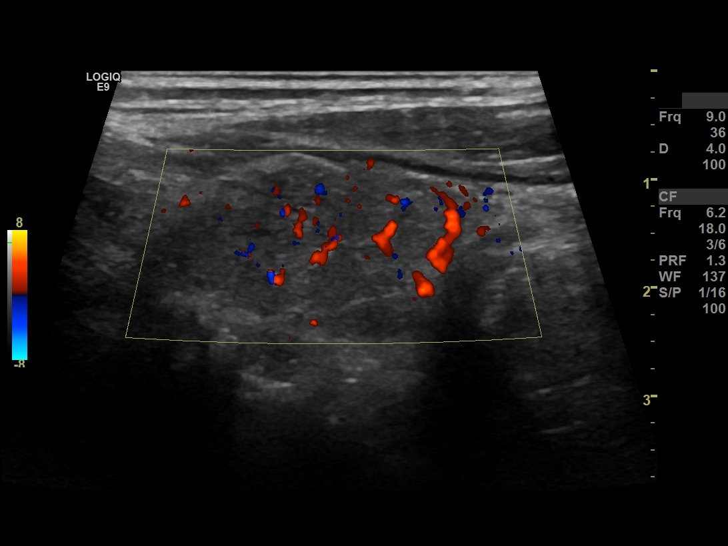
[im 11/43]
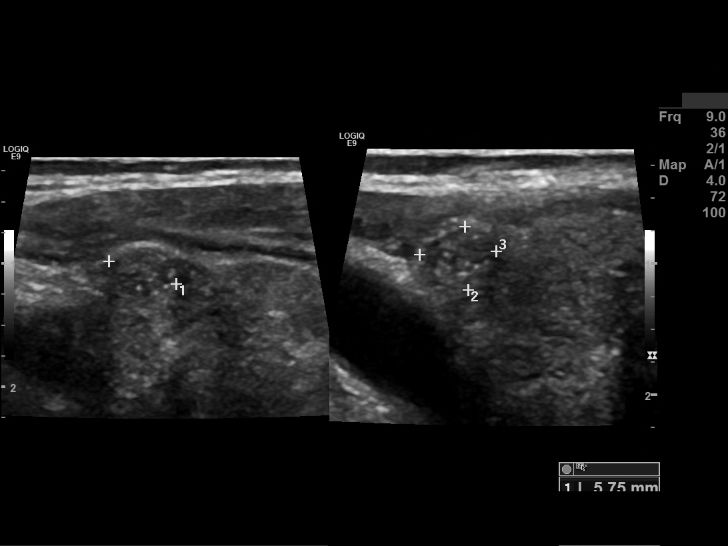
[im 15/43]
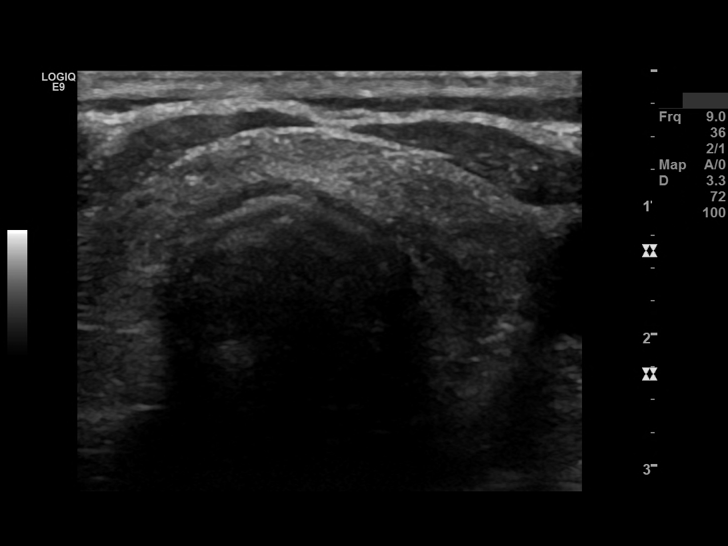
[im 18/43]
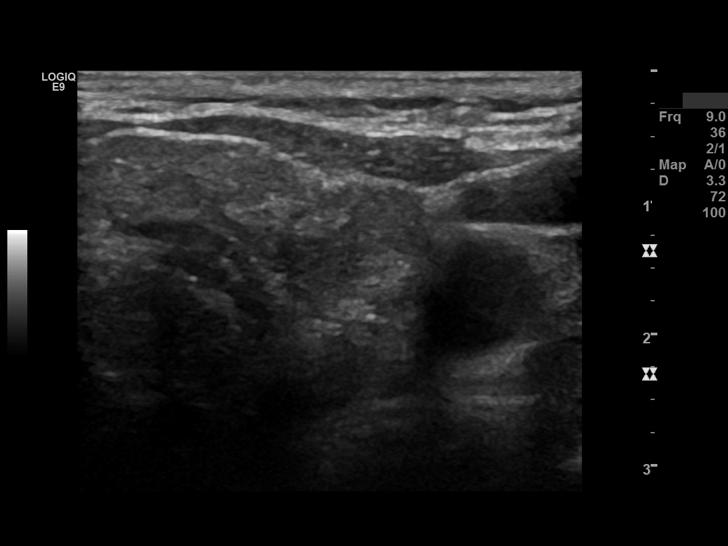
[im 22/43]
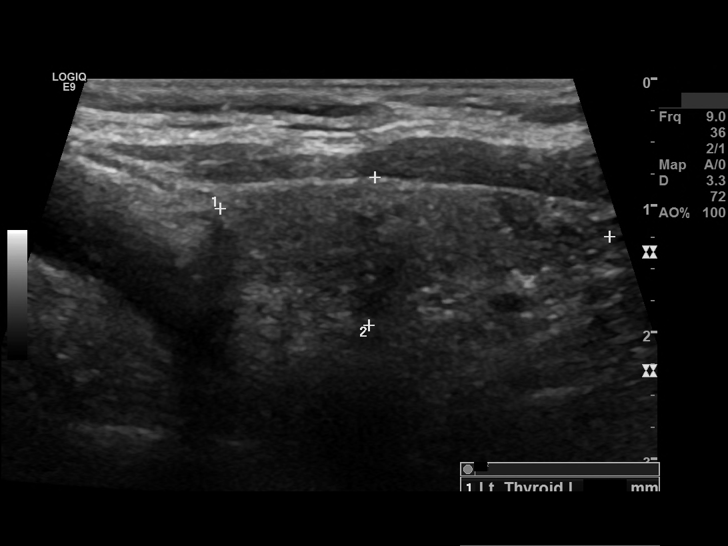
[im 25/43]
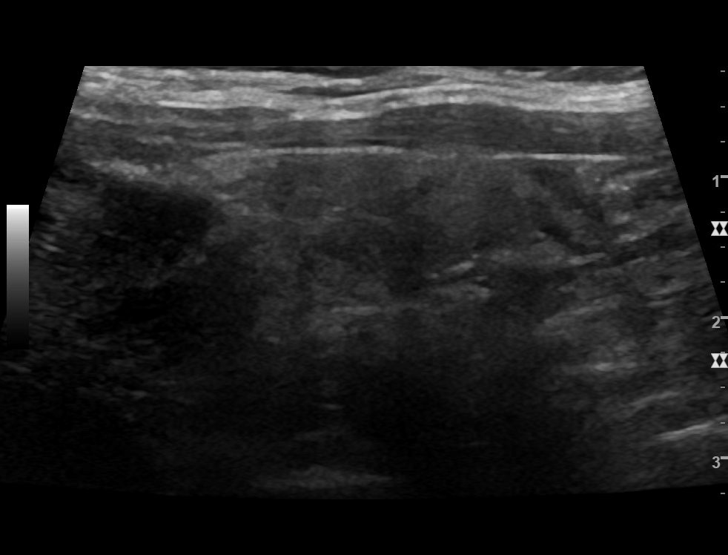
[im 29/43]
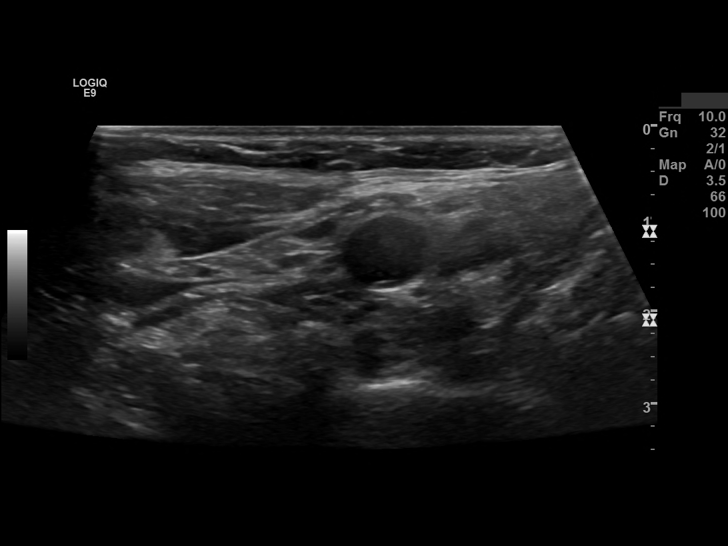
[im 32/43]
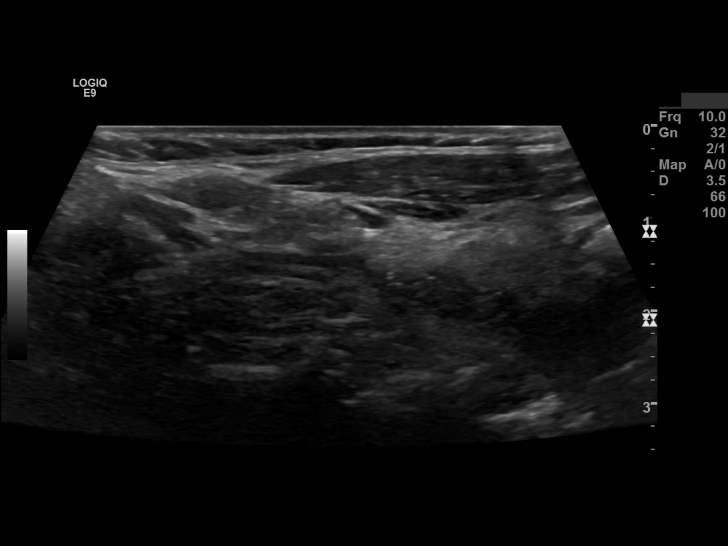
[im 36/43]
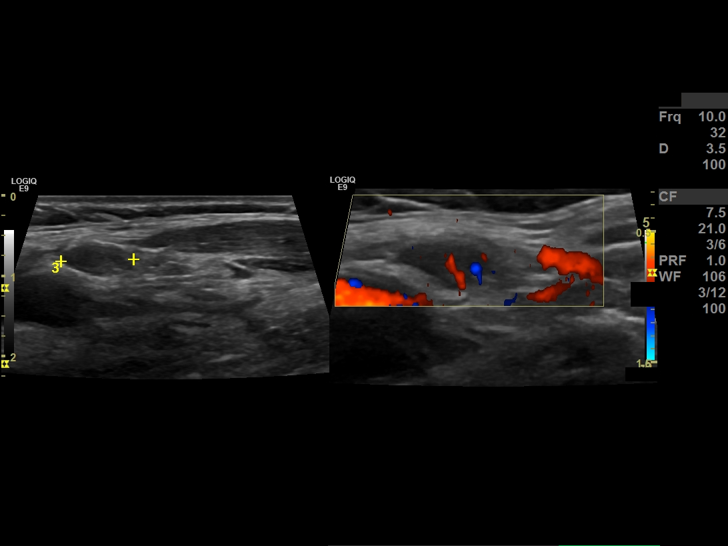
[im 39/43]
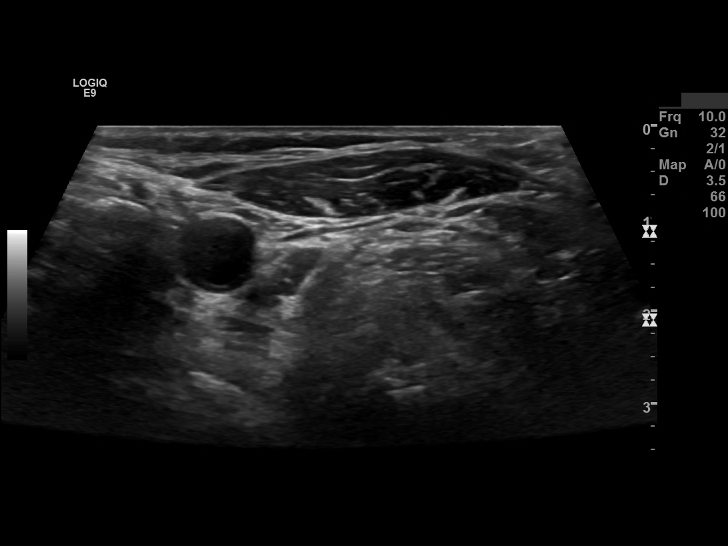
[im 43/43]
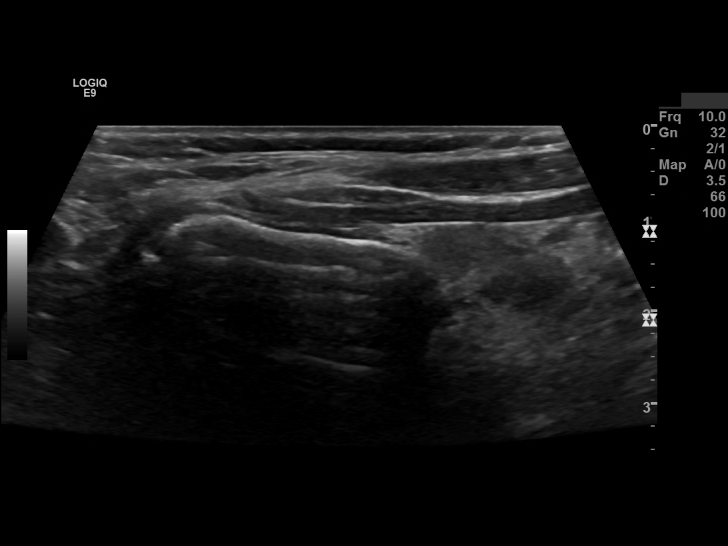

[13 of 25 positions shown; findings below may reference images not displayed]

FINDINGS: THYROID:

Right lobe: The right lobe of the thyroid measures 36 mm in length x 15 mm anterior posterior x 14 mm in width. There is an oval hypoechoic circumscribed 6 x 5 x 6 mm nodule (TR 4) in the superior pole. This is unchanged from previous examinations going back to 9600.

Isthmus: The isthmus measures 4 mm.

Left lobe: The left lobe of thyroid measures 31 mm in length x 12 mm anterior posterior x 15 mm in width.

SOFT TISSUE NECK:

There is a right level III nonspecific lymph node measuring 9 x 5 x 9 mm. Ultrasound evaluation of the left neck soft tissue is negative.
IMPRESSION: 1.
Stable 6 x 5 x 6 mm right thyroid lobe nodule unchanged from 9600. Per below recommendations no further evaluation or follow-up is required.

2.
9 x 5 x 9 mm nonspecific right level III lymph node.

[HOSPITAL] TI-RADS Recommendations: 

TI-RADS 1: Normal, no FNA required

TI-RADS 2: Not suspicious, no FNA required

TI-RADS 3: Mildly suspicious; if 1.5-2.5 cm, follow-up in 1, 3, and 5 years; if greater than 2.5 cm -> FNA

TI-RADS 4: Moderately suspicious; if 1.0-1.5 cm, follow-up in 1, 2, 3, and 5 years; if greater than 1.5 cm -> FNA. 

TI-RADS 5: Highly suspicious, if 0.5-1.0 cm, annual follow-up for up to 5 years; if greater than 1 cm -> FNA

## 2021-03-17 IMAGING — OT DXA BONE DENSITY
2 series · 2 of 2 positions shown · non-contrast
Comparison: none

REASON FOR EXAM: Evaluate bone density.

RISK FACTORS:  Personal history of asthma.
PRIOR EXAMS:  DXA from [HOSPITAL] dated 03/21/2019 diagnosed as osteoporosis based on distal left third radius.
METHOD:  Scans of the spine and forearm were performed using dual energy X-ray densitometry (DXA).

[Series 1: — · left · 1 of 1 slices shown (1 of 2)]
[im 1/1]
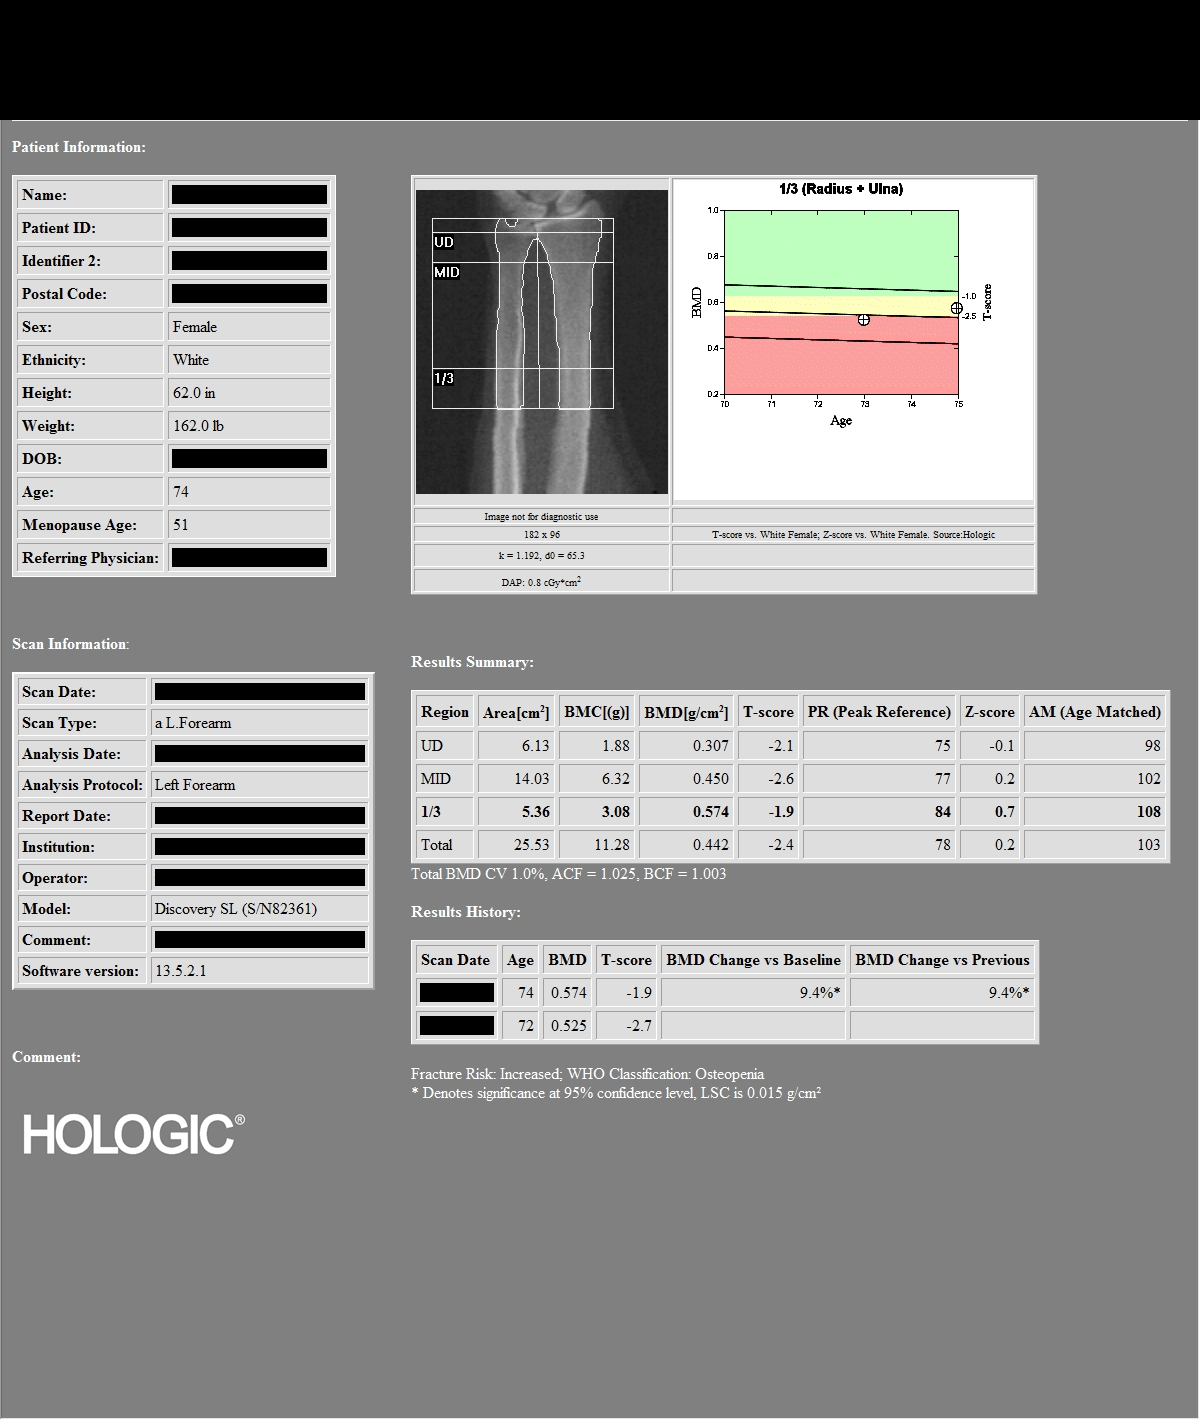

[Series 2: — · 1 of 1 slices shown (2 of 2)]
[im 1/1]
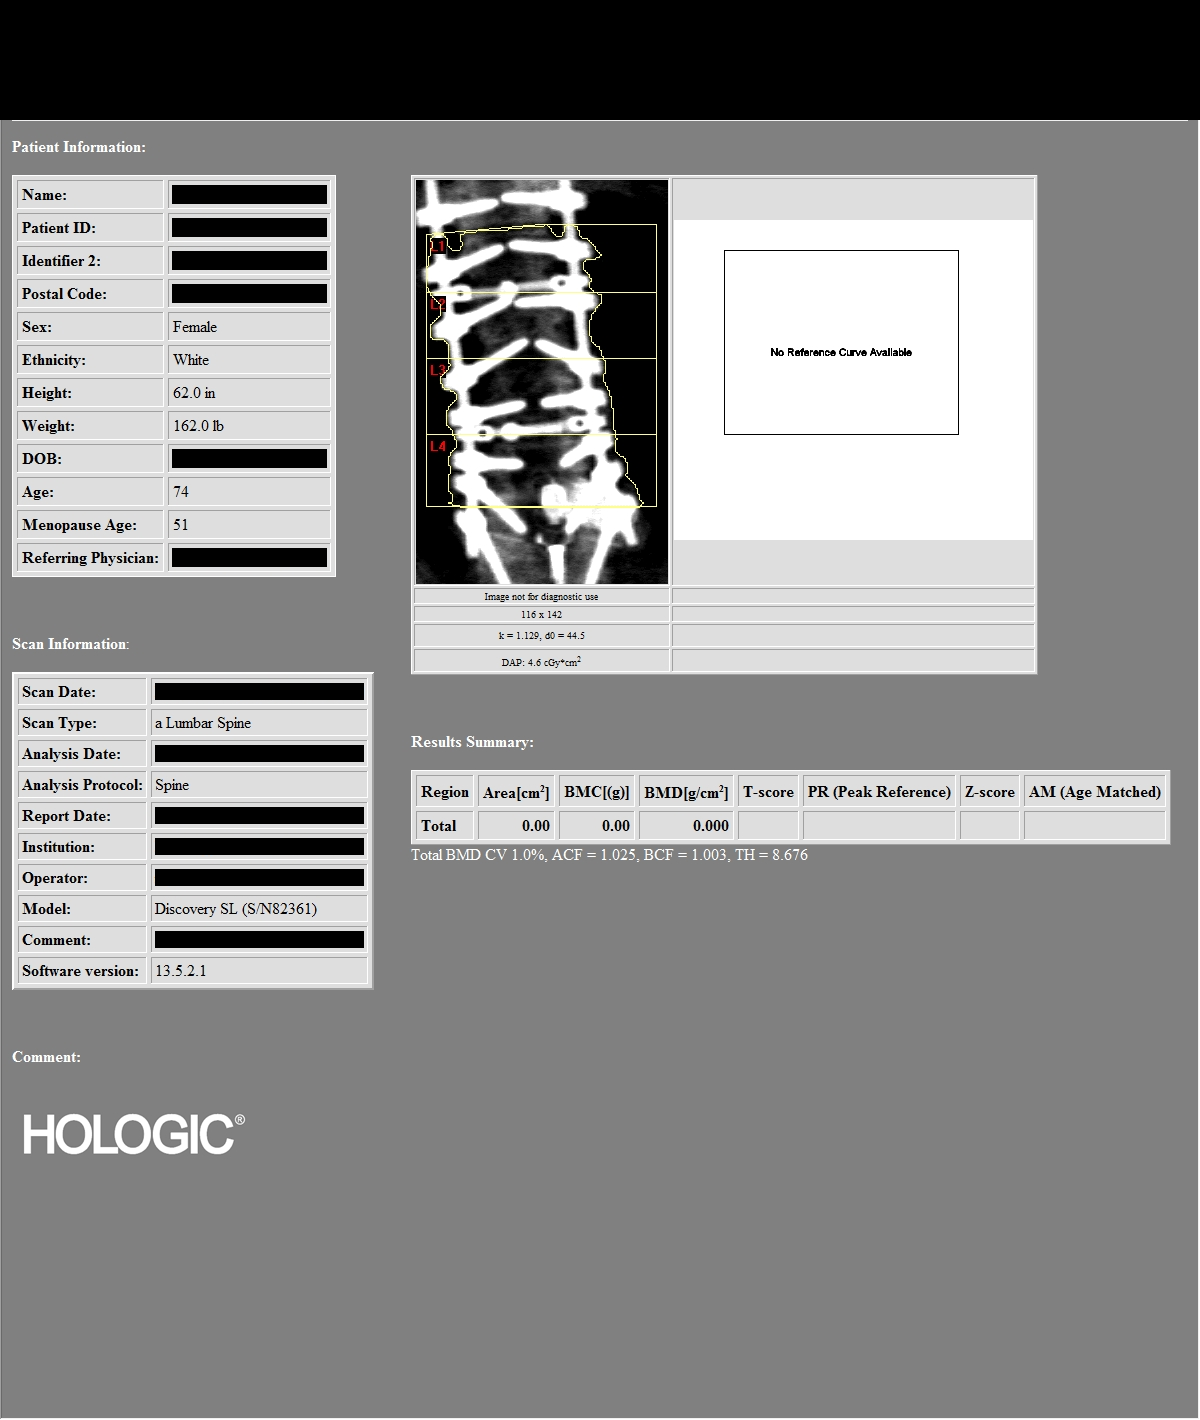

[2 of 2 positions shown; findings below may reference images not displayed]

IMPRESSION: As defined by World Health Organization, the patient meets the criteria for OSTEOPENIA based on distal left third radius T-scores.

PATIENT DEMOGRAPHICS:  74-year-old white  female.
FINDINGS: 1.    Review of scanogram images shows multiple pedicle screws and posterior bars seen throughout the visualized lower thoracic and lumbar spine down to the sacrum and for this reason the lumbar spine is nondiagnostic.  There is bilateral hip surgery and for this reason the hips are nondiagnostic.

2.    The lumbar spine is nondiagnostic due to metallic hardware.

3. The hips are nondiagnostic due to previous surgery.

4.   The left forearm exam using [DATE] radius region of interest shows average Bone Mineral Density is 0.574 gm/cm2 of Hydroxyapatite. The T-score (comparing patient with a young adult group) is 1.9 standard deviations BELOW mean. The Z-score (comparing patient with an age-matched group) is 0.7 standard deviations ABOVE mean.

COMPARED TO PRIOR DXA, forearm bone density was 0.525 gm/cm2.  This is an interval increase of 0.049 gm/cm2 or 9.4 %. Least significant change in the forearm is 0.015 gm/cm2.  This is a statistically significant interval increase.

RECOMMENDATIONS:  The patient states that she is taking supplements on a regular basis.  The patient should continue being a nonsmoker and regular exercise to patient tolerance would be of benefit. The patient is currently taking Prolia for the prevention of bone loss. The [employer]opor[employer] now recommends followup DXA scanning every two years in patients at risk regardless of whether the patient is undergoing pharmacological treatment.

World Health Organization criteria for diagnosis, please see link below.

[URL]

## 2022-02-09 IMAGING — MG MAMMO SCRN BIL W/CAD TOMO
8 series · 8 of 24 positions shown · non-contrast
Comparison: The present examination has been compared to prior imaging studies.

Images Obtained from [facility]
INDICATION: Screening.
TECHNIQUE: Bilateral 2-D digital screening mammogram was performed followed by 3-D tomosynthesis.  Current study was also evaluated with a computer aided detection (CAD) system.
MAMMOGRAM FINDINGS:
There are scattered areas of fibroglandular density.
No suspicious abnormality is seen in either breast.  There are no significant changes from the prior study.

[R MLO]
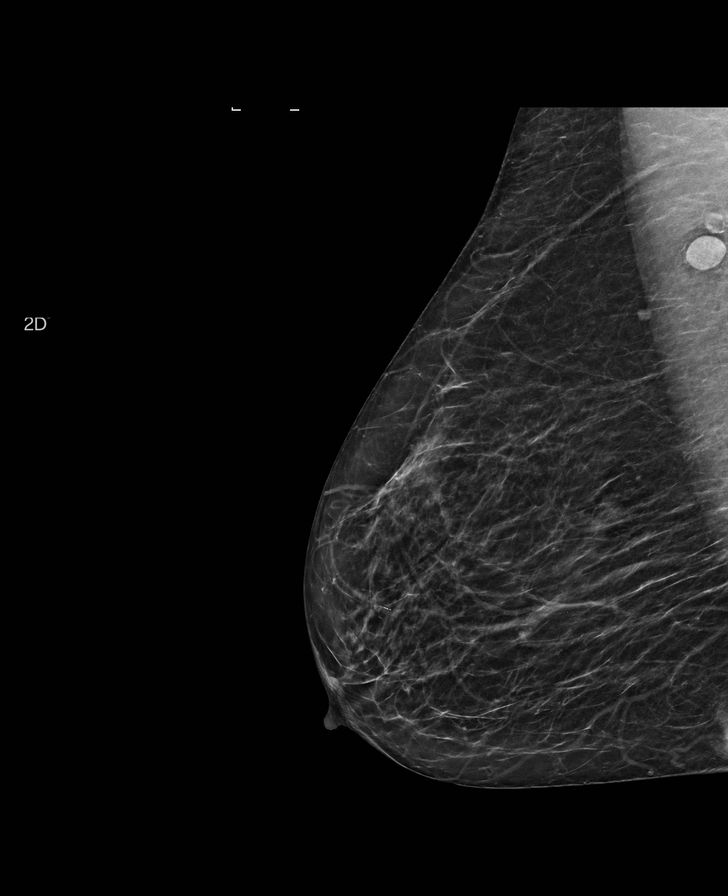

[L CC]
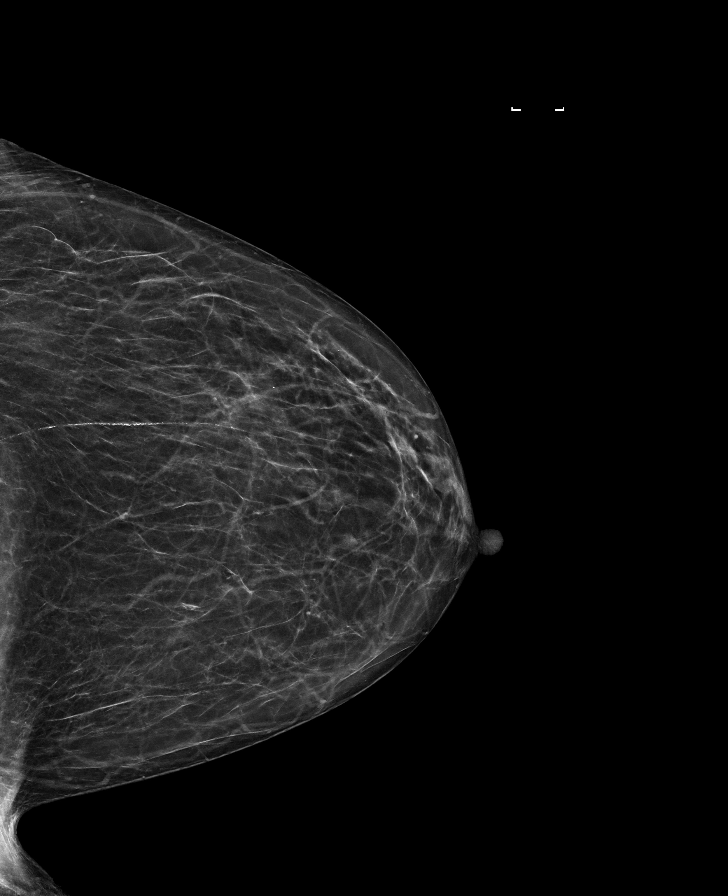

[R CC]
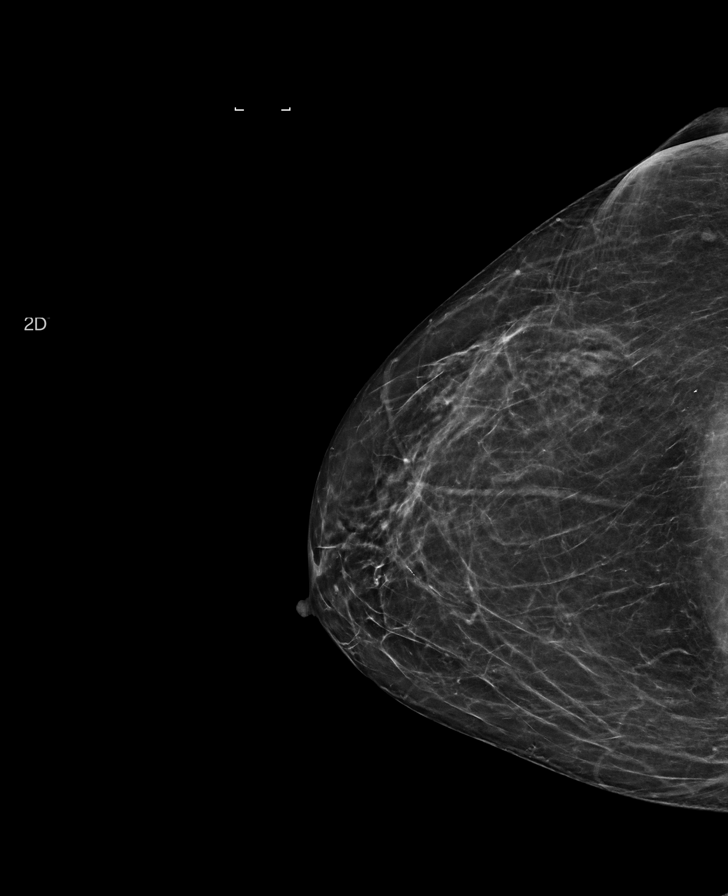

[L MLO]
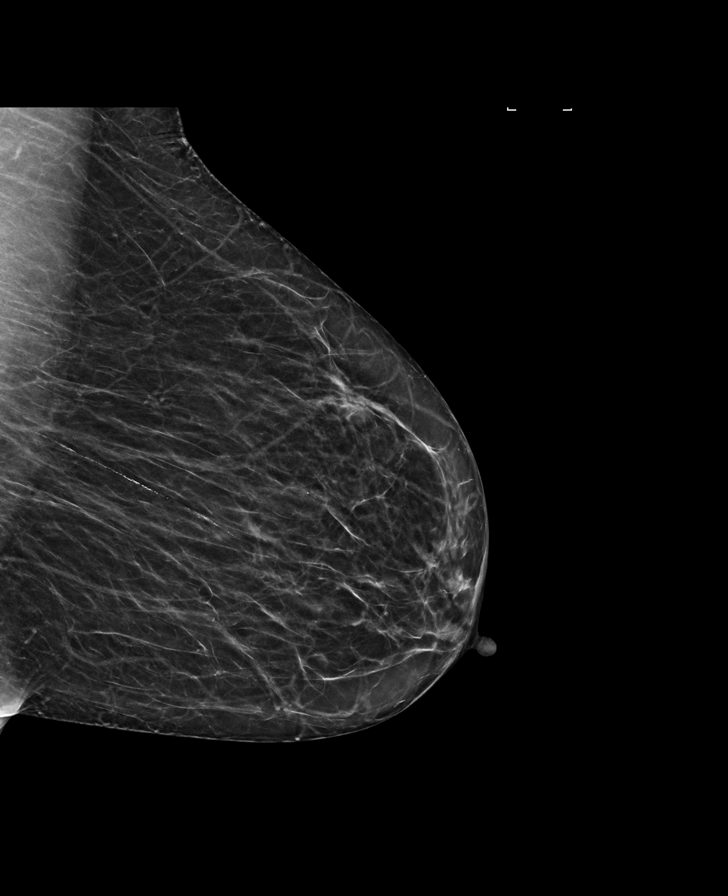

[L MLO tomo · tomo slice 9/16.0]
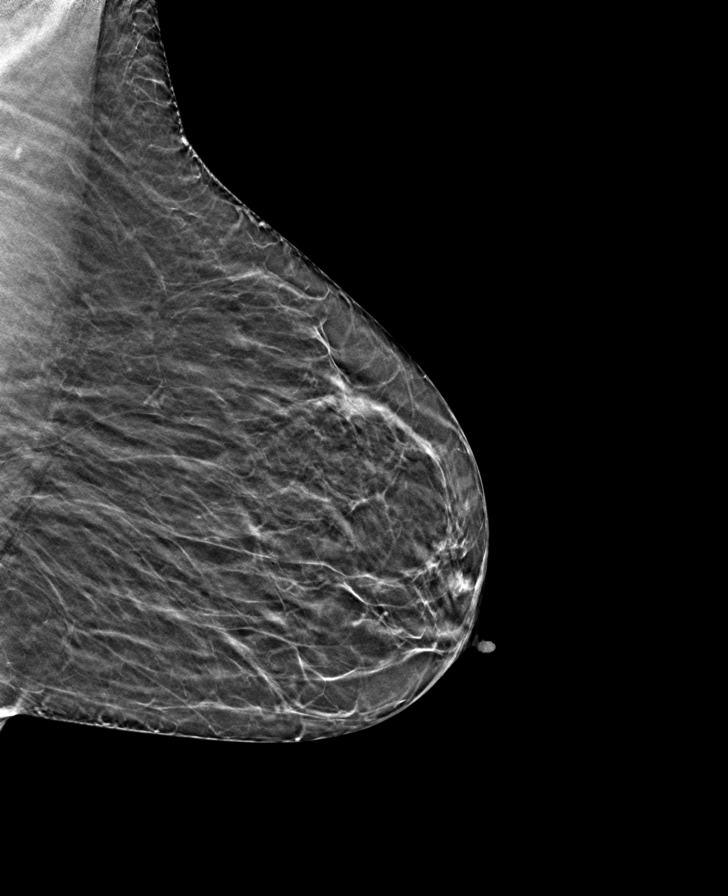

[R CC tomo · tomo slice 9/18.0]
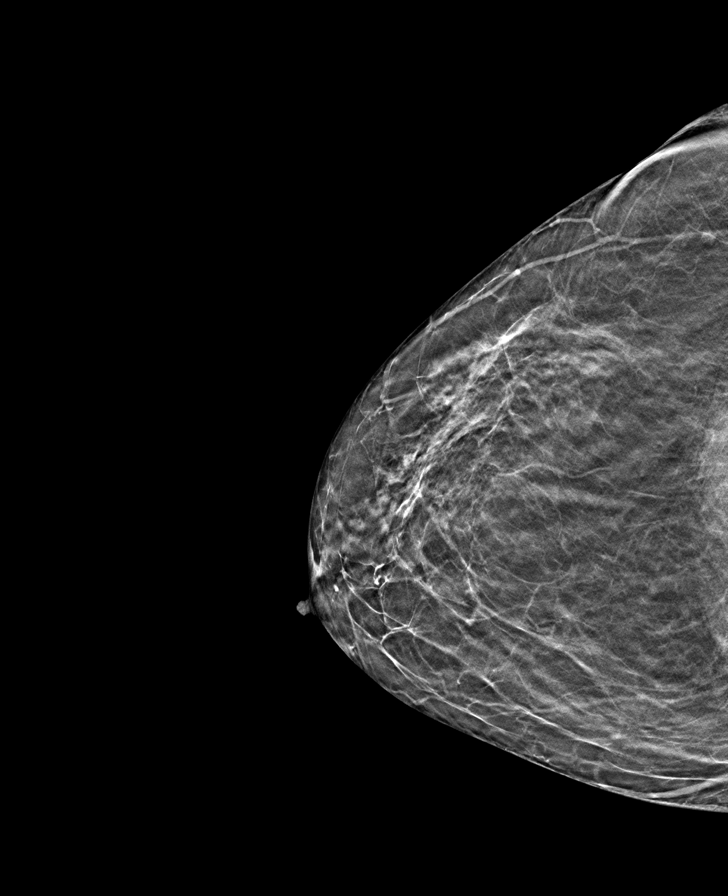

[L CC tomo · tomo slice 9/16.0]
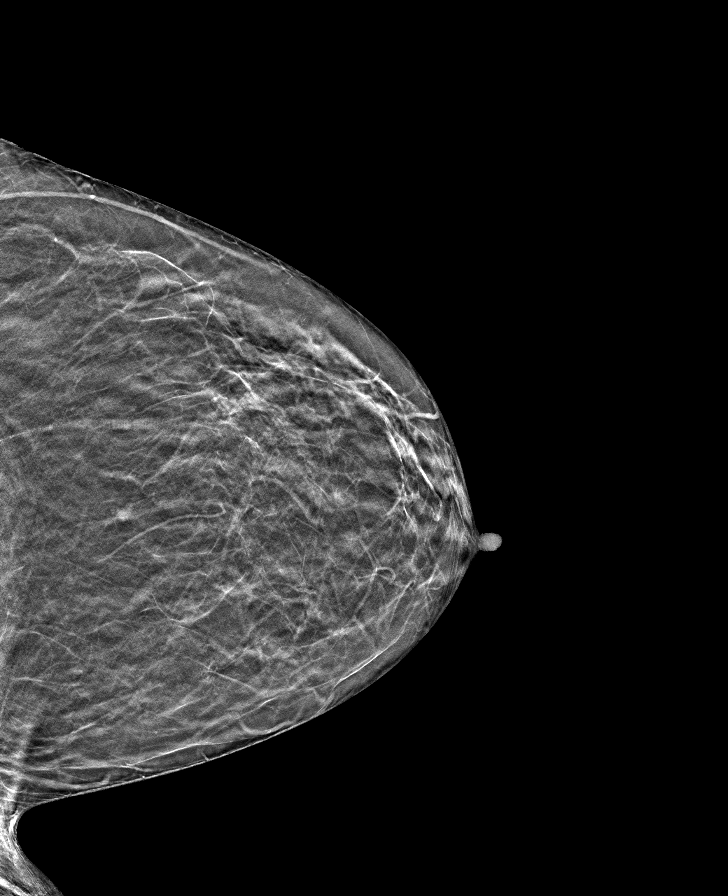

[R MLO tomo · tomo slice 9/17.0]
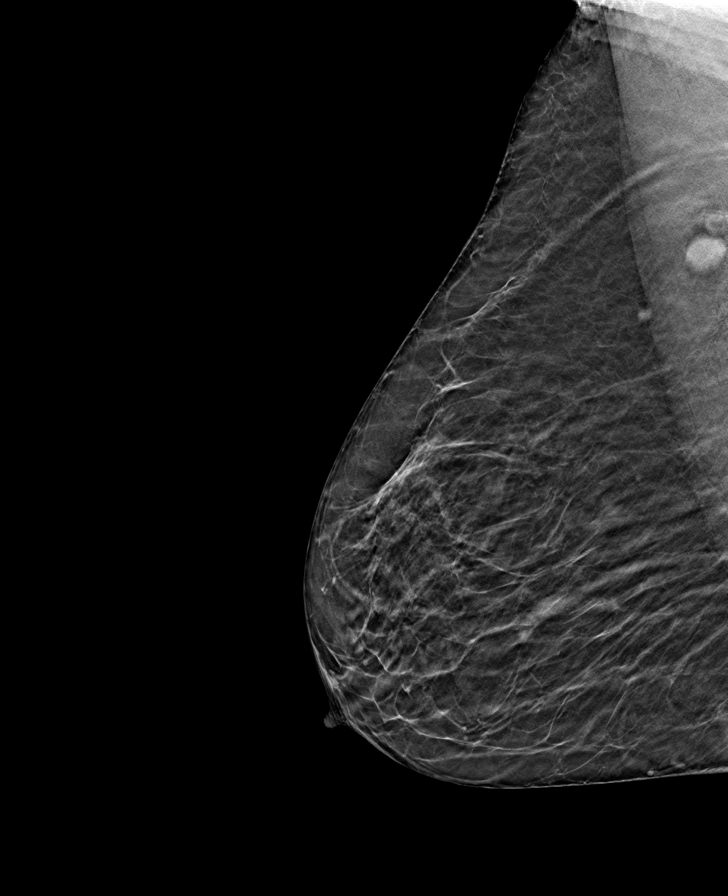

[8 of 24 positions shown; findings below may reference images not displayed]

IMPRESSION: There is no mammographic evidence of malignancy.
Screening mammogram recommended in 1 year.
BI-RADS Category 1: Negative
